# Patient Record
Sex: Male | Born: 1985 | Race: White | Hispanic: No | Marital: Married
Health system: Southern US, Community
[De-identification: ages and names within clinical notes are randomized; demographics above are authoritative.]

## PROBLEM LIST (undated history)

## (undated) DIAGNOSIS — R51 Headache: Secondary | ICD-10-CM

## (undated) DIAGNOSIS — F329 Major depressive disorder, single episode, unspecified: Secondary | ICD-10-CM

## (undated) DIAGNOSIS — IMO0001 Reserved for inherently not codable concepts without codable children: Secondary | ICD-10-CM

## (undated) DIAGNOSIS — G809 Cerebral palsy, unspecified: Secondary | ICD-10-CM

## (undated) DIAGNOSIS — F32A Depression, unspecified: Secondary | ICD-10-CM

## (undated) DIAGNOSIS — F419 Anxiety disorder, unspecified: Secondary | ICD-10-CM

---
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
## 2014-02-28 ENCOUNTER — Emergency Department (HOSPITAL_COMMUNITY): Payer: Self-pay | Admitting: Emergency Medicine

## 2014-08-04 ENCOUNTER — Inpatient Hospital Stay
Admission: AD | Admit: 2014-08-04 | Discharge: 2014-08-04 | DRG: 101 | Discharge status: Against Medical Advice | Payer: MEDICAID | Source: Other Acute Inpatient Hospital | Attending: Neurology | Admitting: Neurology

## 2014-08-04 ENCOUNTER — Encounter (HOSPITAL_COMMUNITY): Payer: Self-pay | Admitting: Neurology

## 2014-08-04 ENCOUNTER — Inpatient Hospital Stay (HOSPITAL_COMMUNITY): Payer: MEDICAID | Admitting: Internal Medicine

## 2014-08-04 DIAGNOSIS — R569 Unspecified convulsions: Secondary | ICD-10-CM | POA: Diagnosis present

## 2014-08-04 DIAGNOSIS — G809 Cerebral palsy, unspecified: Secondary | ICD-10-CM | POA: Diagnosis present

## 2014-08-04 DIAGNOSIS — F419 Anxiety disorder, unspecified: Secondary | ICD-10-CM | POA: Diagnosis present

## 2014-08-04 DIAGNOSIS — S0990XA Unspecified injury of head, initial encounter: Secondary | ICD-10-CM | POA: Diagnosis present

## 2014-08-04 DIAGNOSIS — R55 Syncope and collapse: Secondary | ICD-10-CM

## 2014-08-04 DIAGNOSIS — F319 Bipolar disorder, unspecified: Secondary | ICD-10-CM | POA: Diagnosis present

## 2014-08-04 DIAGNOSIS — W1830XA Fall on same level, unspecified, initial encounter: Secondary | ICD-10-CM | POA: Diagnosis present

## 2014-08-04 HISTORY — DX: Reserved for inherently not codable concepts without codable children: IMO0001

## 2014-08-04 HISTORY — DX: Depression, unspecified: F32.A

## 2014-08-04 HISTORY — DX: Cerebral palsy, unspecified (CMS HCC): G80.9

## 2014-08-04 HISTORY — DX: Anxiety disorder, unspecified: F41.9

## 2014-08-04 LAB — CBC/DIFF
BASOPHILS: 1 %
BASOS ABS: 0.1 THOU/uL (ref 0.000–0.200)
EOS ABS: 0.295 10*3/uL (ref 0.000–0.500)
EOSINOPHIL: 2 %
HCT: 42.7 % (ref 36.7–47.0)
HGB: 14.4 g/dL (ref 12.5–16.3)
LYMPHOCYTES: 25 %
LYMPHS ABS: 2.969 10*3/uL (ref 1.000–4.800)
MCH: 29 pg (ref 27.4–33.0)
MCHC: 33.7 g/dL (ref 32.5–35.8)
MCV: 86.1 fL (ref 78–100)
MONOCYTES: 8 %
MONOS ABS: 0.976 10*3/uL (ref 0.300–1.000)
MONOS ABS: 0.976 THOU/uL (ref 0.300–1.000)
MPV: 8 fL (ref 7.5–11.5)
PLATELET COUNT: 285 THOU/uL (ref 140–450)
PMN ABS: 7.771 10*3/uL — ABNORMAL HIGH (ref 1.500–7.700)
PMN'S: 64 %
RBC: 4.96 MIL/uL (ref 4.06–5.63)
RBC: 4.96 MIL/uL (ref 4.06–5.63)
RDW: 14.1 % (ref 12.0–15.0)
WBC: 12.1 THOU/uL — ABNORMAL HIGH (ref 3.5–11.0)

## 2014-08-04 LAB — MAGNESIUM: MAGNESIUM: 2.1 mg/dL (ref 1.6–2.5)

## 2014-08-04 LAB — BASIC METABOLIC PANEL
ANION GAP: 11 mmol/L (ref 4–13)
BUN/CREAT RATIO: 15 (ref 6–22)
BUN: 13 mg/dL (ref 8–25)
CALCIUM: 9.7 mg/dL (ref 8.5–10.4)
CARBON DIOXIDE: 25 mmol/L (ref 22–32)
CHLORIDE: 105 mmol/L (ref 96–111)
CHLORIDE: 105 mmol/L (ref 96–111)
CREATININE: 0.85 mg/dL (ref 0.62–1.27)
ESTIMATED GLOMERULAR FILTRATION RATE: >59 ml/min/1.73m2 (ref >59)
GLUCOSE,NONFAST: 99 mg/dL (ref 65–139)
POTASSIUM: 3.9 mmol/L (ref 3.5–5.1)
SODIUM: 141 mmol/L (ref 136–145)

## 2014-08-04 LAB — PHOSPHORUS: PHOSPHORUS: 3.3 mg/dL (ref 2.4–4.7)

## 2014-08-04 MED ORDER — ACETAMINOPHEN 325 MG TABLET
650.0000 mg | ORAL_TABLET | ORAL | Status: DC | PRN
Start: 2014-08-04 — End: 2014-08-04

## 2014-08-04 MED ORDER — BISACODYL 5 MG TABLET,DELAYED RELEASE
5.0000 mg | DELAYED_RELEASE_TABLET | Freq: Every evening | ORAL | Status: DC | PRN
Start: 2014-08-04 — End: 2014-08-04
  Filled 2014-08-04 (×2): qty 1

## 2014-08-04 MED ORDER — ALPRAZOLAM 0.5 MG TABLET
1.00 mg | ORAL_TABLET | Freq: Two times a day (BID) | ORAL | Status: DC | PRN
Start: 2014-08-04 — End: 2014-08-04

## 2014-08-04 MED ORDER — ENOXAPARIN 40 MG/0.4 ML SUBCUTANEOUS SYRINGE
40.0000 mg | INJECTION | SUBCUTANEOUS | Status: DC
Start: 2014-08-04 — End: 2014-08-04
  Filled 2014-08-04: qty 0.4

## 2014-08-04 MED ORDER — RISPERIDONE 1 MG TABLET
1.00 mg | ORAL_TABLET | Freq: Every evening | ORAL | Status: DC
Start: 2014-08-04 — End: 2014-08-04
  Filled 2014-08-04 (×2): qty 1

## 2014-08-04 MED ORDER — LURASIDONE 40 MG TABLET
60.0000 mg | ORAL_TABLET | Freq: Every day | ORAL | Status: DC
Start: 2014-08-04 — End: 2014-08-04
  Filled 2014-08-04 (×2): qty 1

## 2014-08-04 MED ORDER — SENNOSIDES 8.6 MG-DOCUSATE SODIUM 50 MG TABLET
1.0000 | ORAL_TABLET | Freq: Two times a day (BID) | ORAL | Status: DC | PRN
Start: 2014-08-04 — End: 2014-08-04
  Filled 2014-08-04 (×2): qty 1

## 2014-08-04 MED ORDER — MULTIVITAMIN TABLET
1.0000 | ORAL_TABLET | Freq: Every day | ORAL | Status: DC
Start: 2014-08-04 — End: 2014-08-04
  Filled 2014-08-04: qty 1

## 2014-08-04 MED ORDER — DIVALPROEX 500 MG TABLET,DELAYED RELEASE
500.00 mg | DELAYED_RELEASE_TABLET | Freq: Every evening | ORAL | Status: DC
Start: 2014-08-04 — End: 2014-08-04
  Filled 2014-08-04 (×2): qty 1

## 2014-08-04 MED ORDER — LEVETIRACETAM 500 MG TABLET
1500.00 mg | ORAL_TABLET | Freq: Every evening | ORAL | Status: DC
Start: 2014-08-04 — End: 2014-08-04
  Filled 2014-08-04 (×2): qty 3

## 2014-08-04 MED ORDER — ONDANSETRON HCL (PF) 4 MG/2 ML INJECTION SOLUTION
4.0000 mg | Freq: Four times a day (QID) | INTRAMUSCULAR | Status: DC | PRN
Start: 2014-08-04 — End: 2014-08-04

## 2014-08-04 MED ADMIN — sodium chloride 0.9 % intravenous solution: @ 04:00:00 | NDC 00338004904

## 2014-08-04 NOTE — Care Management Notes (Signed)
Patient left AMA this a.m. after refusing treatment. Unable to determine plan of care or any needs.

## 2014-08-04 NOTE — Progress Notes (Addendum)
Interval Progress Note    The patient refused to have an IV placed and became combative with the nursing staff. I discussed with the patient that he could not continue to be evaluated for seizures without IV placement in case he would actively start to seize and need an IV PRN medication. The patient asked if he could have something through his "butt" instead, and I feel that this is further evidence that he will continue to be inappropriate and potentially threatening towards staff members if he is to stay in hospital. Because the patient continues to refuse IV placement I will be discharging him AMA at this time since I will be unable to appropriately evaluate him for seizures. I discussed that per [facility] law the patient is unable to drive for at least 6 months after several episodes of unexplained loss of consciousness occurring earlier today. It was also explained that seizures can be potentially life threatening, and so he may be putting his life in danger by leaving the hospital with an incomplete medical evaluation. The patient voiced understanding but refused to sign [facility].     Hillery HunterMandy Hatfield, MD  08/04/2014, 03:14  Norva RiffleGauri Bradee Common, MD  08/04/2014, 12:26

## 2014-08-04 NOTE — Nurses Notes (Signed)
0200: Patient arrived to room 953 via EMS with reports of seizure like activity. Patient oriented to new environment. Assessment per flowsheet. Patient oriented to unit routines. I explained to patient he will need to wear a gown due to EEG leads and PIV that will be placed. Patient became anxious about having an IV and stated that he was told at previous facility that he would not need an IV. At that time, Dr Hillery HunterMandy Hatfield of Neurology arrived at bedside to assess patient and I informed her that patient is adamant that he does not want an IV. Dr Reymundo PollHatfield discussed with patient that he will need an IV to receive care due to risks associated with seizures. Patient then agreed to an IV.    0230: After Dr Reymundo PollHatfield completed assessment of patient, I entered room to place PIV. Patient became agitated and states he does not want "a metal IV". I attempted to explain that the IV has a metal needle to access vein and that only plastic catheter is left as part of saline lock. Patient told me that if I "try an IV and it hurts, there will be trouble". Patient then stated he was leaving and telephoned his brother, Franky MachoLuke. I told patient that I would not attempt an IV against his wishes. Patient became adamant that he wanted to leave hospital and had me speak to his brother who was apologetic and stated that patient "has done this before". Patient's brother [place]. Patient began dressing himself and became verbally aggressive and cursing towards his brother on the phone and at staff. Charge nurse, Melchor Amourarisa Tedrow, RN attempted to diffuse the situation to no avail and felt the patient was escalating towards physical aggression and thusly called security. At that time, I paged Dr Reymundo PollHatfield and notified her that patient did not want me to place an IV and was behaving aggressively towards staff. Security arrived at bedside. Patient continued to yell and curse at staff and his brother on the phone stating  that he did not want to be in the hospital. Dr Reymundo PollHatfield paged and arrived at bedside to discuss benefits of staying in the hospital and the risks of leaving. She stated that he would not be able to receive proper care without an IV and would have to discharge him AMA. Patient stated he wanted to leave but would not sign AMA form. Patient was escorted off unit by security at 0305.

## 2014-08-04 NOTE — H&P (Addendum)
[facility]   Neurology H&P      [name],[name], 29 y.o. male  Date of Admission:  08/04/2014  Date of Birth:  [birth year]    PCP: No primary care provider on file.    Information obtained from: patient  Chief Complaint: Seizures    ZOX:[id number] [name] is a 29 y.o., White male with past medical history of cerebral palsy, bipolar depression, and anxiety who presents as a transfer from an outside facility for the evaluation of spells. The patient reports that he has had over 100 seizures today during which he becomes zoned out and confused. He is unable to report how long the episodes last, but he states he has had seizures since the age of 29 years old. The patient retains consciousness at times, and at other times he has complete loss of consciousness. He reports that earlier today he lost consciousness with one of the episodes and he hit his face on the floor when he fell. He states that he has been frequently wetting the bed but denies biting his tongue or losing bowel continence. He gets headaches in association with his spells as well. Currently the patient is taking Keppra 2000 mg qhs and Depakote 500 mg qhs. He also takes Xanax 05. Mg BID PRN anxiety. He denies missing any doses of these medications. Additionally the patient denies any recent illness including UTI, URI or GI illness. The outside facility staff noted facial twitching without loss of consciousness or fall while he was being evaluated in the ED.     Admission Source:  Transfer from another hospital -  [place]  Advance Directives:  None-Discussed  Hospice involvement prior to admission?  Not applicable    Location (of pain): Quality (character of pain) Severity (minimal, mild, severe, scale or 1-10) Duration (how long has pain/sx present) Timing (when does pain/sx occur)  Context (activity at/before onset) Modifying Factors (what makes pain/sx  Better/worse) Associate Sign/Sx (what accompanies main pain/sx)    Past Medical History      Diagnosis Date    Spells     Anxiety     Depression     Cerebral palsy      Surgical History: None  No past surgical history on file.    Medications Prior to Admission     Prescriptions    ALPRAZolam (XANAX) 1 mg Oral Tablet    Take 1 mg by mouth Twice per day as needed for Anxiety    divalproex (DEPAKOTE) 500 mg Oral Tablet, Delayed Release (E.C.)    Take 500 mg by mouth Every evening    LevETIRAcetam (KEPPRA) 750 mg Oral Tablet    Take 1,500 mg by mouth Every evening    multivitamin Oral Tablet    Take 1 Tab by mouth Once a day    risperiDONE (RISPERDAL) 1 mg Oral Tablet    Take 1 mg by mouth Every evening        Allergies not on file  History   Substance Use Topics    Smoking status: Not on file    Smokeless tobacco: Not on file    Alcohol Use: Not on file     Family History: No family history of seizures.     Review of Systems:  Constitutional: Negative for fever, chills, and weakness.   Head/Eyes: Negative for vision changes, headaches or recent head trauma.   ENT: Negative for neck pain or difficulty swallowing.   Cardiovascular: Negative for chest pain, palpitations and leg swelling.  Respiratory: Negative for experiencing shortness of breath or respiratory distress.   Gastrointestinal: Negative for nausea, vomiting, abdominal pain, diarrhea and constipation.   Renal/Urinary: Negative for dysuria or changes in urgency and frequency.  Skin: Negative for rashes and pruritis.   Musculoskeletal: Negative for joint or muscle pain.   Neurological: Positive for spells of loss of consciousness  Endocrine/Metabolic: Negative for polydipsia or heat/cold intolerance.   Hematological: Negative easy bleeding or clotting history   Psychiatric: Positive for bipolar disorder and anxiety    Exam:  Temperature: 36.7 C (98.1 F)  Heart Rate: 90  BP (Non-Invasive): 104/72 mmHg  Respiratory Rate: 18  General: no distress  HENT:Periorbital ecchymosis noted above the right eye  Neck: No JVD or thyromegaly or  lymphadenopathy  Carotids:Carotids normal without bruit  Lungs: Clear to auscultation bilaterally.   Cardiovascular: regular rate and rhythm  Abdomen: Soft, non-tender, Bowel sounds normal  Extremities: No cyanosis or edema  Ophthalomscopic: normal w/o hemorrhages, exudates, or papilledema  Mental status:  Level of Consciousness: alert  Orientations: Alert and oriented x 3  MemoryRegistration, Recall, and Following of commands is normal  AttentionsAttention and Concentration are normal  Knowledge: Good  Language: Normal  Speech: Normal  Cranial nerves:   CN2: Visual acuity and fields intact  CN 3,4,6: PERRLA. Patient is noted to have dysconjugate gaze with right eye resting in the down and out position. Patient states this has been present since birth.   CN 5Facial sensation intact  CN 7Face symmetrical  CN 8: Hearing grossly intact  CN 9,10: Palate symmetric and gag normal  CN 11: Sternocleidomastoid and Trapezius have normal strength.  CN 12: Tongue normal with no fasiculations or deviation  Gait, Coordination, and Reflexes:   Gait: Normal  Coordination: Coordination is normal without tremor    Muscle tone: WNL  Muscle exam  Arm Right Left Leg Right Left   Deltoid 5/5 5/5 Iliopsoas 5/5 5/5   Biceps 5/5 5/5 Quads 5/5 5/5   Triceps 5/5 5/5 Hamstrings 5/5 5/5   Wrist Extension 5/5 5/5 Ankle Dorsi Flexion 5/5 5/5   Wrist Flexion 5/5 5/5 Ankle Plantar Flexion 5/5 5/5   Interossei 5/5 5/5 Ankle Eversion 5/5 5/5   APB 5/5 5/5 Ankle Inversion 5/5 5/5       Reflexes   RJ BJ TJ KJ AJ Plantars Hoffman's   Right 2+ 2+ 2+ 2+ 2+ Downgoing Not present   Left 2+ 2+ 2+ 2+ 2+ Downgoing Not present     Sensory: Sensory exam in the upper and lower extremities is normal  Normal color, texture and turgor without significant lesions or rashes  Diabetes Monitors:   FOOTEXAM: Both feet without edema or ulcerations. Pulses normal bilaterally. Sensation normal bilaterally      Labs:    Lab Results for Last 24 Hours:    Results for orders  placed or performed during the hospital encounter of 08/04/14 (from the past 24 hour(s))   CBC/DIFF   Result Value Ref Range    WBC 12.1 (H) 3.5 - 11.0 THOU/uL    RBC 4.96 4.06 - 5.63 MIL/uL    HGB 14.4 12.5 - 16.3 g/dL    HCT 16.1 09.6 - 04.5 %    MCV 86.1 78 - 100 fL    MCH 29.0 27.4 - 33.0 pg    MCHC 33.7 32.5 - 35.8 g/dL    RDW 40.9 81.1 - 91.4 %    PLATELET COUNT 285 140 - 450 THOU/uL  MPV 8.0 7.5 - 11.5 fL    PMN'S 64 %    PMN ABS 7.771 (H) 1.500 - 7.700 THOU/uL    LYMPHOCYTES 25 %    LYMPHS ABS 2.969 1.000 - 4.800 THOU/uL    MONOCYTES 8 %    MONOS ABS 0.976 0.300 - 1.000 THOU/uL    EOSINOPHIL 2 %    EOS ABS 0.295 0.000 - 0.500 THOU/uL    BASOPHILS 1 %    BASOS ABS 0.100 0.000 - 0.200 THOU/uL   BASIC METABOLIC PANEL, NON-FASTING   Result Value Ref Range    SODIUM 141 136 - 145 mmol/L    POTASSIUM 3.9 3.5 - 5.1 mmol/L    CHLORIDE 105 96 - 111 mmol/L    CARBON DIOXIDE 25 22 - 32 mmol/L    ANION GAP 11 4 - 13 mmol/L    CREATININE 0.85 0.62 - 1.27 mg/dL    ESTIMATED GLOMERULAR FILTRATION RATE >59 >59 ml/min/1.6473m2    GLUCOSE,NONFAST 99 65 - 139 mg/dL    BUN 13 8 - 25 mg/dL    BUN/CREAT RATIO 15 6 - 22    CALCIUM 9.7 8.5 - 10.4 mg/dL   MAGNESIUM   Result Value Ref Range    MAGNESIUM 2.1 1.6 - 2.5 mg/dL   PHOSPHORUS   Result Value Ref Range    PHOSPHORUS 3.3 2.4 - 4.7 mg/dL   THYROID STIMULATING HORMONE WITH FREE T4 REFLEX   Result Value Ref Range    THYROID STIMULATING HORMONE WITH FREE T4 REFLEX 1.152 0.350 - 5.000 uIU/mL       Review of reports and notes reveal:        Independent Interpretation of images or specimens:  None to review.     Assessment/Plan: The patient is a 29 year old male with past medical history of cerebral palsy, bipolar depression, and anxiety who presents as a transfer from an outside facility for the evaluation of spells.    Spells of Altered Consciousness  - Will place on vEEG monitoring with the goal of spell capture  - Continue home AEDs including Keppra 2000 mg qhs and Depakote 500  mg qhs  - Basic labs pending including urine drug screen  - Seizure precautions, neuro checks, vitals    Head Injury  - Patient reports fall from standing during one of his spells  - CT Brain wo contrast ordered to r/o head injury    Bipolar Disorder, Anxiety  - Continue home Latuda 60 mg daily and Risperdal 1 mg qhs  - Continue home Xanax 0.5 mg BID PRN anxiety    DNR Status this admission:  Full Code  Palliative/Supportive Care consulted?  no  Hospice Consulted?  Not applicable    Current Comorbid Conditions:  Brain Compression:  no  Obstructive Hydocephalus:  no  Coma -Not applicable  Eye opening: 4 spontaneous, Verbal resonse:  5 oriented, Best motor response:  6 obeys commands  TIA not applicable  Cerebral Edema:  no  Encephalopathy:  no  Encephalitis-Not applicable  Seizure-Unspecified   Respiratory Failure/Other-Not applicable  Coagulopathy Not applicable      DVT/PE Prophylaxis: Lovenox    Hillery HunterMandy Hatfield, MD  08/04/2014, 04:49        Did not see the patient.  Norva RiffleGauri Jese Comella, MD  08/04/2014, 12:26

## 2014-08-06 DIAGNOSIS — Z8659 Personal history of other mental and behavioral disorders: Secondary | ICD-10-CM

## 2014-08-06 DIAGNOSIS — R29818 Other symptoms and signs involving the nervous system: Secondary | ICD-10-CM

## 2014-08-06 DIAGNOSIS — F4310 Post-traumatic stress disorder, unspecified: Secondary | ICD-10-CM

## 2014-08-07 ENCOUNTER — Encounter (HOSPITAL_COMMUNITY): Payer: MEDICAID

## 2014-08-07 ENCOUNTER — Inpatient Hospital Stay
Admission: AD | Admit: 2014-08-07 | Discharge: 2014-08-12 | DRG: 101 | Disposition: A | Discharge status: Home / Self Care | Payer: MEDICAID | Source: Other Acute Inpatient Hospital | Attending: Internal Medicine | Admitting: Internal Medicine

## 2014-08-07 ENCOUNTER — Encounter (HOSPITAL_COMMUNITY): Payer: Self-pay

## 2014-08-07 DIAGNOSIS — F411 Generalized anxiety disorder: Secondary | ICD-10-CM | POA: Diagnosis present

## 2014-08-07 DIAGNOSIS — Z720 Tobacco use: Secondary | ICD-10-CM

## 2014-08-07 DIAGNOSIS — G40919 Epilepsy, unspecified, intractable, without status epilepticus: Principal | ICD-10-CM | POA: Diagnosis present

## 2014-08-07 DIAGNOSIS — F1290 Cannabis use, unspecified, uncomplicated: Secondary | ICD-10-CM | POA: Diagnosis present

## 2014-08-07 DIAGNOSIS — F410 Panic disorder [episodic paroxysmal anxiety] without agoraphobia: Secondary | ICD-10-CM | POA: Diagnosis present

## 2014-08-07 DIAGNOSIS — F319 Bipolar disorder, unspecified: Secondary | ICD-10-CM | POA: Diagnosis present

## 2014-08-07 DIAGNOSIS — R569 Unspecified convulsions: Secondary | ICD-10-CM

## 2014-08-07 DIAGNOSIS — S40811A Abrasion of right upper arm, initial encounter: Secondary | ICD-10-CM | POA: Diagnosis present

## 2014-08-07 DIAGNOSIS — S0081XA Abrasion of other part of head, initial encounter: Secondary | ICD-10-CM | POA: Diagnosis present

## 2014-08-07 DIAGNOSIS — F419 Anxiety disorder, unspecified: Secondary | ICD-10-CM

## 2014-08-07 DIAGNOSIS — R6889 Other general symptoms and signs: Secondary | ICD-10-CM

## 2014-08-07 DIAGNOSIS — G809 Cerebral palsy, unspecified: Secondary | ICD-10-CM | POA: Diagnosis present

## 2014-08-07 DIAGNOSIS — IMO0001 Reserved for inherently not codable concepts without codable children: Secondary | ICD-10-CM

## 2014-08-07 HISTORY — DX: Headache: R51

## 2014-08-07 LAB — HEPATIC FUNCTION PANEL
ALBUMIN: 3.6 g/dL (ref 3.5–5.0)
ALKALINE PHOSPHATASE: 56 U/L (ref <150)
ALT (SGPT): 25 U/L (ref <55)
AST (SGOT): 20 U/L (ref 8–48)
BILIRUBIN, TOTAL: 0.4 mg/dL (ref 0.3–1.3)
BILIRUBIN,CONJUGATED: <0.2 mg/dL (ref <0.3)
TOTAL PROTEIN: 6.7 g/dL (ref 6.4–8.3)

## 2014-08-07 LAB — DRUG SCREEN, HIGH OPIATE CUTOFF, NO CONFIRMATION, URINE
AMPHETAMINE, URINE: NEGATIVE
BUPRENORPHINE, URINE QL: NEGATIVE
ECSTASY/MDMA QUAL, URINE: NEGATIVE
METHADONE, URINE: NEGATIVE
OPIATE, URINE: NEGATIVE
RANDOM URINE CREATININE: 292 mg/dL — ABNORMAL HIGH (ref 20–200)

## 2014-08-07 LAB — CBC/DIFF
BASOPHILS: 1 %
BASOS ABS: 0.071 THOU/uL (ref 0.000–0.200)
EOS ABS: 0.22 10*3/uL (ref 0.000–0.500)
EOSINOPHIL: 2 %
HCT: 43 % (ref 36.7–47.0)
HGB: 14.1 g/dL (ref 12.5–16.3)
LYMPHOCYTES: 26 %
LYMPHS ABS: 2.74 THOU/uL (ref 1.000–4.800)
MCH: 28.7 pg (ref 27.4–33.0)
MCHC: 32.9 g/dL (ref 32.5–35.8)
MCV: 87.3 fL (ref 78–100)
MONOCYTES: 10 %
MONOS ABS: 0.997 THOU/uL (ref 0.300–1.000)
MPV: 8.4 fL (ref 7.5–11.5)
PLATELET COUNT: 290 10*3/uL (ref 140–450)
PMN ABS: 6.501 THOU/uL (ref 1.500–7.700)
PMN'S: 61 %
RBC: 4.93 MIL/uL (ref 4.06–5.63)
RDW: 13.9 % (ref 12.0–15.0)
WBC: 10.5 10*3/uL (ref 3.5–11.0)

## 2014-08-07 LAB — BASIC METABOLIC PANEL
ANION GAP: 10 mmol/L (ref 4–13)
BUN/CREAT RATIO: 24 — ABNORMAL HIGH (ref 6–22)
BUN: 22 mg/dL (ref 8–25)
CALCIUM: 9.5 mg/dL (ref 8.5–10.4)
CARBON DIOXIDE: 26 mmol/L (ref 22–32)
CHLORIDE: 103 mmol/L (ref 96–111)
CREATININE: 0.9 mg/dL (ref 0.62–1.27)
ESTIMATED GLOMERULAR FILTRATION RATE: >59 ml/min/1.73m2 (ref >59)
GLUCOSE,NONFAST: 93 mg/dL (ref 65–139)
POTASSIUM: 3.8 mmol/L (ref 3.5–5.1)
SODIUM: 139 mmol/L (ref 136–145)

## 2014-08-07 LAB — VALPROIC ACID LEVEL
TIME OF LAST DOSE: 1814 h
VALPROIC ACID: 34 ug/mL — ABNORMAL LOW (ref 50–100)

## 2014-08-07 MED ORDER — MULTIVITAMIN TABLET
1.0000 | ORAL_TABLET | Freq: Every day | ORAL | Status: DC
Start: 2014-08-07 — End: 2014-08-12
  Administered 2014-08-07 – 2014-08-12 (×6): 1 via ORAL
  Filled 2014-08-07 (×6): qty 1

## 2014-08-07 MED ORDER — RISPERIDONE 1 MG TABLET
1.00 mg | ORAL_TABLET | Freq: Every evening | ORAL | Status: DC
Start: 2014-08-07 — End: 2014-08-07
  Filled 2014-08-07: qty 1

## 2014-08-07 MED ORDER — DIVALPROEX 500 MG TABLET,DELAYED RELEASE
500.00 mg | DELAYED_RELEASE_TABLET | Freq: Every evening | ORAL | Status: DC
Start: 2014-08-07 — End: 2014-08-08
  Administered 2014-08-07: 500 mg via ORAL
  Filled 2014-08-07 (×2): qty 1

## 2014-08-07 MED ORDER — NICOTINE 21 MG/24 HR DAILY TRANSDERMAL PATCH
21.0000 mg | MEDICATED_PATCH | Freq: Every day | TRANSDERMAL | Status: DC
Start: 2014-08-08 — End: 2014-08-12
  Administered 2014-08-08: 21 mg via TRANSDERMAL
  Administered 2014-08-09: 0 mg via TRANSDERMAL
  Administered 2014-08-09: 21 mg via TRANSDERMAL
  Administered 2014-08-10: 0 mg via TRANSDERMAL
  Administered 2014-08-11 – 2014-08-12 (×3): 21 mg via TRANSDERMAL
  Filled 2014-08-07 (×5): qty 1

## 2014-08-07 MED ORDER — ENOXAPARIN 40 MG/0.4 ML SUBCUTANEOUS SYRINGE
40.0000 mg | INJECTION | SUBCUTANEOUS | Status: DC
Start: 2014-08-07 — End: 2014-08-12
  Administered 2014-08-07 – 2014-08-11 (×5): 40 mg via SUBCUTANEOUS
  Filled 2014-08-07: qty 0.4
  Filled 2014-08-07: qty 0
  Filled 2014-08-07 (×3): qty 0.4
  Filled 2014-08-07: qty 0

## 2014-08-07 MED ORDER — ALPRAZOLAM 0.5 MG TABLET
1.00 mg | ORAL_TABLET | Freq: Two times a day (BID) | ORAL | Status: DC | PRN
Start: 2014-08-07 — End: 2014-08-12
  Administered 2014-08-08 – 2014-08-11 (×3): 1 mg via ORAL
  Filled 2014-08-07 (×4): qty 2

## 2014-08-07 MED ORDER — ACETAMINOPHEN 325 MG TABLET
650.0000 mg | ORAL_TABLET | ORAL | Status: DC | PRN
Start: 2014-08-07 — End: 2014-08-12
  Administered 2014-08-08 – 2014-08-11 (×6): 650 mg via ORAL
  Filled 2014-08-07 (×7): qty 2

## 2014-08-07 MED ORDER — LEVETIRACETAM 500 MG TABLET
1000.00 mg | ORAL_TABLET | Freq: Two times a day (BID) | ORAL | Status: DC
Start: 2014-08-07 — End: 2014-08-08
  Administered 2014-08-07 – 2014-08-08 (×2): 1000 mg via ORAL
  Filled 2014-08-07 (×3): qty 2

## 2014-08-07 MED ORDER — LEVETIRACETAM 500 MG TABLET
2000.00 mg | ORAL_TABLET | Freq: Every evening | ORAL | Status: DC
Start: 2014-08-07 — End: 2014-08-07
  Filled 2014-08-07: qty 4

## 2014-08-07 NOTE — Care Management Notes (Signed)
M[facility] INTAKE      Referring Provider and Contact Number:  Dr. Milinda Pointer   Transfer Source and Contact Number:   [facility] [phone]  Transfer Emergent: No     Date Admitted:  07/28/14  Diagnosis:  Partial Seizures   PMH:      Past Medical History  Current Outpatient Prescriptions   Medication Sig    ALPRAZolam (XANAX) 1 mg Oral Tablet Take 1 mg by mouth Twice per day as needed for Anxiety    divalproex (DEPAKOTE) 500 mg Oral Tablet, Delayed Release (E.C.) Take 500 mg by mouth Every evening    LevETIRAcetam (KEPPRA) 750 mg Oral Tablet Take 2,000 mg by mouth Every evening     multivitamin Oral Tablet Take 1 Tab by mouth Once a day    risperiDONE (RISPERDAL) 1 mg Oral Tablet Take 1 mg by mouth Every evening       Past Medical History   Diagnosis Date    Spells     Anxiety     Depression     Cerebral palsy        Dialysis:   No     Cancer:  No     Isolation:  No     Is patient established with family practice/attending?    Reason for transfer:  Pt needs continuous EEG monitoring     Patient story/clinical presentation:  Patient presented with symptoms of partial seizures starting last week.  During event patient's HR increases to 140, the event only lasts briefly and his neck turns to the left and he clinches up and is briefly confused after event.  [facility] physician increased home dose of Keppra and Depakote but the patient continuous to have several of these partial seizures throughout the day.  MRI head showed nonspecific focus and regular EEG showed generalized slowing.  Neurologist at [facility] recommends patient needs 24 hr EEG monitoring which is reason for transfer.  IV ativan given for partial seizures but it makes the patient sleep for several hours after and family does not like this side effect, so they have backed off giving ativan as frequently per request of family.      Current vital signs:    HR:     80   BP:   118/52   Resp:     16   Temp:     37.3   Sats:     95%   O2:     RA        Per MD labs WNL unless noted below.    Labs:  WBC (3.6-11)    HGB (13.1-17.3)    Hct (39.8-50.2)    PLT (140-400)    Na (135-145)    K+ (3.5-5.1)    CL (96-111)    CO2 (23-35)    BUN (8-26)    Cr (0.62-1.27)    Glucose (60-105)    Ca (8.5-10.4)    Mag (1.6-2.5)    Phos (2.4-4.7)    BNP (<100)    D-Dimer (<233)    AST (8-41)    ALT (<55)    Alk Phos (<150)    Amylase (25-125)    Lipase (10-80)    T. Bili (0.3-1.3)    PT (8.7-13.2)    PTT (25.1-36.5)    INR (0.8-1.2)    Trip-1 (<0.03)    CK    CPK    CK-MB    ABG ph    ABG PCO2    ABG PO2  ABG HCO3    Base def/exc    LP Glucose    LP Neutrophil    LP Protein    LP WBC    LP Other    Ammonia Level    Prolactin Level  Admit 53  Now 36  65.4       Radiology (please place images on image grid): MRI head and regular EEG uploaded to image grid if they have the capability.  EKG:  IV Access:  Medications, IVF, Drips: IV ativan PRN     Oxygen/Bipap/Vent settings:    TV:        Peep:        FiO2:        Rate:            Pt class and accommodation:  Inpatient/Semi-private tele EMU bed   Service and accepting MD:  Neurology 1/Dr. Joseph Pierini     *Send Text Page to accepting service MD. *

## 2014-08-07 NOTE — Nurses Notes (Signed)
Patient staring and turned head to side . Grabbing at siderails but answering brother's questions.Side rails padded.

## 2014-08-07 NOTE — Nurses Notes (Signed)
Patient staring with head turned to right. Grabbing at siderails and talking lasting 15 seconds.

## 2014-08-07 NOTE — H&P (Addendum)
[facility]   Neurology H&P      [name],[name], [age] y.o. male  Date of Admission:  08/07/2014  Date of Birth:  05/06/1985    PCP: No Established Pcp    Information obtained from: patient and brother  Chief Complaint:  Seplls    GNF:[id number]:[name] Rubie MaidMikita is a 29 y.o., White male  with past medical history of cerebral palsy, bipolar depression, and anxiety who [facility] of spells. Patient has been having multiple spells per day that last only a few seconds. He is not postical afterwards. No incontinence. No LOC. He is able to commnicate during spell. He has been evaluated in 8 hospitals recently. His Neurologist Dr. Marney Doctorana told him it was psychiatric. Patient was recently admitted to the service on 4/13 but left AMA shortly after admission. Per Dr. Willette ClusterHatfield's note "The patient reports that he has had over 100 seizures today during which he becomes zoned out and confused. He is unable to report how long the episodes last, but he states he has had seizures since the age of [age] years old. The patient retains consciousness at times, and at other times he has complete loss of consciousness. He reports that earlier today he lost consciousness with one of the episodes and he hit his face on the floor when he fell. He states that he has been frequently wetting the bed but denies biting his tongue or losing bowel continence. He gets headaches in association with his spells as well. Currently the patient is taking Keppra 2000 mg qhs and Depakote 500 mg qhs. He also takes Xanax 05. Mg BID PRN anxiety. He denies missing any doses of these medications. Additionally the patient denies any recent illness including UTI, URI or GI illness. The outside facility staff noted facial twitching without loss of consciousness or fall while he was being evaluated in the ED."    Admission Source:  [facility]  Advance Directives:  None-Discussed  Hospice involvement prior to  admission?  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    Medications Prior to Admission     Prescriptions    ALPRAZolam (XANAX) 1 mg Oral Tablet    Take 1 mg by mouth Twice per day as needed for Anxiety    divalproex (DEPAKOTE) 500 mg Oral Tablet, Delayed Release (E.C.)    Take 500 mg by mouth Every evening    LevETIRAcetam (KEPPRA) 750 mg Oral Tablet    Take 2,000 mg by mouth Every evening     multivitamin Oral Tablet    Take 1 Tab by mouth Once a day    risperiDONE (RISPERDAL) 1 mg Oral Tablet    Take 1 mg by mouth Every evening        Allergies no known allergies     History   Substance Use Topics    Smoking status: yes    Smokeless tobacco: Not on file    Alcohol Use: No     Family History:     -- No family history of seizures.   -- Hx of DM and Heart Disease    ROS: Other than ROS  in the HPI, all other systems were negative.    Exam:       General: appears chronically ill, unkempt male, abrasions on face and echimosis on right arm  HENT:Head atraumatic and normocephalic  Neck: No JVD or thyromegaly  Carotids:Carotids normal without bruit  Lungs: Clear to auscultation bilaterally.   Cardiovascular: regular rate and rhythm  Abdomen: Soft, non-tender  Extremities: No cyanosis or edema  Ophthalomscopic: normal w/o hemorrhages, exudates, or papilledema  Mental status:  Level of Consciousness: alert  Orientations: Alert and oriented x 3  MemoryRegistration, Recall, and Following of commands is normal  AttentionsAttention and Concentration are normal  Knowledge: Good  Language: Normal  Speech: Normal  Cranial nerves:   CN2: Visual acuity and fields intact  CN 3,4,6: EOMI, PERRLA  CN 5Facial sensation intact  CN 7Face symmetrical  CN 8: Hearing grossly intact  CN 9,10: Palate symmetric  CN 11: Sternocleidomastoid and Trapezius have  normal strength.  CN 12: Tongue normal with no fasiculations or deviation  Gait, Coordination, and Reflexes:   Gait: did not evaluate due to frequent spells  Coordination: Coordination is normal without tremor    Muscle tone: WNL  Muscle exam  Arm Right Left Leg Right Left   Deltoid 5/5 5/5 Iliopsoas 5/5 5/5   Biceps 5/5 5/5 Quads 5/5 5/5   Triceps 5/5 5/5 Hamstrings 5/5 5/5   Wrist Extension 5/5 5/5 Ankle Dorsi Flexion 5/5 5/5   Wrist Flexion 5/5 5/5 Ankle Plantar Flexion 5/5 5/5   Interossei 5/5 5/5 Ankle Eversion 5/5 5/5   APB 5/5 5/5 Ankle Inversion 5/5 5/5       Reflexes   RJ BJ TJ KJ AJ Plantars Hoffman's   Right 2+ 2+ 2+ 2+ 2+ Downgoing Not present   Left 2+ 2+ 2+ 2+ 2+ Downgoing Not present     Sensory: Sensory exam in the upper and lower extremities is normal  Normal color, texture and turgor without significant lesions or rashes  Diabetes Monitors:  Patient not a diabetic.      Labs:    I have reviewed all lab results.      Independent Interpretation of images or specimens:  None to review    Assessment/Plan:  Active Hospital Problems    Diagnosis    Spells    Cerebral palsy    Bipolar depression    Anxiety    Seizure       Patient is a 29 year old male with past medical history of cerebral palsy, bipolar depression, and anxiety who presents as a transfer from an outside facility for the evaluation of spells.    Spells    - Witnessed numerous spells at bedside. Appear non epileptic.  - Will place on vEEG monitoring with the goal of spell capture  - Continue home AEDs including Keppra 1000 mg BID and Depakote 500 mg qhs. Will check Depakote level.  - Basic labs pending including urine drug screen  - Seizure precautions, neuro checks, vitals      Bipolar Disorder, Anxiety    - Continue home Xanax 0.5 mg BID PRN anxiety    DNR Status this admission:  Full Code  Palliative/Supportive Care consulted?  no  Hospice Consulted?  Not applicable      Risk Factors:  Brain Compression:  no  Obstructive  Hydocephalus:  no  Coma -Not applicable  TIA not applicable  Cerebral Edema:  no  Encephalopathy:  no  Encephalitis-Not applicable  Seizure-Not applicable   Respiratory  Failure/Other-Not applicable  Coagulopathy Not applicable          DVT/PE Prophylaxis: Lovenox    [name] Curls, DO 08/07/2014, 18:15       Late entry for 08/07/14. I saw and examined the patient.  I reviewed the resident's note.  I agree with the findings and plan of care as documented in the resident's note.  Any exceptions/additions are edited/noted.    Georges Mouse, MD 08/08/2014, 08:65

## 2014-08-07 NOTE — Progress Notes (Addendum)
[facility]  Neurology Progress Note      [name],[name], 29 y.o. male  Date of Admission:  08/07/2014  Date of service: 08/08/2014  Date of Birth:  August 07, 1985      Chief Complaint: Spells  Pt's condition today: stable      Subjective: Patient was seen and examined. Hooked up to VEEG overnight. Had spells overnight. Brother at bedside pushed button for typical spells. Patient stares and turns to the right with his right arm. No postical period.     Vital Signs:  Temp (24hrs) Max:37 C (98.6 F)      Systolic (24hrs), Avg:110 mmHg, Min:107 mmHg, Max:116 mmHg    Diastolic (24hrs), Avg:66 mmHg, Min:59 mmHg, Max:72 mmHg    Temp  Avg: 36.7 C (98 F)  Min: 36.3 C (97.3 F)  Max: 37 C (98.6 F)  Pulse  Avg: 91.5  Min: 76  Max: 107  Resp  Avg: 17.5  Min: 16  Max: 18  SpO2  Avg: 95 %  Min: 94 %  Max: 96 %  Pain Score (Numeric, Faces): 4    Today's Physical Exam:    General:alert  Mental status:Alert and oriented x 3  Memory: Registration, Recall, and Following of commands is normal  Attention: Attention and Concentration are normal  Knowledge: Good  Language and Speech: Normal and Normal  Cranial nerves: Cranial nerves 2-12 are normal  Muscle tone: WNL  Motor strength:  Motor strength is normal throughout.  Sensory: Sensory exam in the upper and lower extremities is normal  Gait: Normal  Coordination: Coordination is normal without tremor  Reflexes: Reflexes are 2/2 throughout      Current Medications:    Current Facility-Administered Medications:  acetaminophen (TYLENOL) tablet 650 mg Oral Q4H PRN   ALPRAZolam (XANAX) tablet 1 mg Oral 2x/day PRN   divalproex (DEPAKOTE) 12 hr delayed release tablet 500 mg Oral QPM   enoxaparin PF (LOVENOX) 40 mg/0.4 mL SubQ injection 40 mg Subcutaneous Q24H   levETIRAcetam (KEPPRA) tablet 1,000 mg Oral 2x/day   multivitamin tablet 1 Tab Oral Daily   nicotine (NICODERM CQ) transdermal patch (mg/24 hr) 21 mg Transdermal Daily       I/O:  I/O last 24 hours:      Intake/Output  Summary (Last 24 hours) at 08/08/14 1610  Last data filed at 08/08/14 0400   Gross per 24 hour   Intake   1030 ml   Output    400 ml   Net    630 ml     I/O current shift:  04/17 0000 - 04/17 0759  In: 420 [P.O.:420]  Out: -     Labs  Please indicate ordered or reviewed)  Reviewed: I have reviewed all lab results.    Review of reports and notes reveal:     Independent Interpretation of images or specimens:  1. MRI of the Brain reviewed on Image grid and shows right frontal hyperintensity.      Assessment/Plan:  Active Hospital Problems    Diagnosis    Spells    Cerebral palsy    Bipolar depression    Anxiety    Seizure         Patient is a 30 year old male with PMH of epilepsy on Keppra and Depakote, cerebral palsy, bipolar depression, and anxiety who presents as a transfer from an outside facility for the evaluation of spells.    Seizures     - VEEG: Multiple spells captured overnight. Patient turns  to the right and speech arrest. Quickly returns to baseline.   -- Valproic Acid level is low; 34.   -- Ativan 2 mg IV now.   -- Will load with Depakote 20 mg/kg IV now. Increase Depakote to 750 mg BID. AST and ALT WNL.   - Continue  Keppra 1000 mg BID    -- Continue VEEG.   - Urine drug screen positive for THC  -- Neuro checks, Seizure precautions    DVT/PE Prophylaxis: Lovenox    Romona CurlsBenjamin Bradley Pollock, DO 08/08/2014, 06:38       Interval Update:        Patient continues to have right sided seizures. Will give Ativan 2 mg IV now and reload with Keppra 1000 mg IV . Increase Keppra maintainance to 1500 mg BID.     AED Regiment:     1. Keppra 1500 mg BID   2. Depakote 750 mg BID      Will check Depakote trough level tonight.   Continue VEEG. Will need repeat MRI once off VEEG.       Romona CurlsBenjamin Bradley Pollock, DO  08/08/2014, 14:37        I saw and examined the patient.  I reviewed the resident's note.  I agree with the findings and plan of care as documented in the resident's note.  Any exceptions/additions are  edited/noted.    Georges MouseShumaila Najee Cowens, MD 08/08/2014, 21:15

## 2014-08-07 NOTE — Nurses Notes (Signed)
1745[facility]. Alert oriented. Slow to respond to some questions. Brother at bed side and states this is his normal and he is easily distracted.Placed on sitter select and video monitor. Oriented to call system and not to get out of bed without assistance.

## 2014-08-07 NOTE — Nurses Notes (Signed)
Patient arrived to 9E via bed by two nurse escorts. Patient in stable condition and alert and oriented x4 upon arrival. Full assessment per flow sheet. Patient on sitter select as a safety precaution. Patient oriented to [employer] policies, procedures, and call bell system. Patient has no complaints of pain at this time. Patient awaiting VEEG placement. Will continue to assess.

## 2014-08-08 DIAGNOSIS — R569 Unspecified convulsions: Secondary | ICD-10-CM

## 2014-08-08 LAB — VALPROIC ACID LEVEL
TIME OF LAST DOSE: 1130 hr
VALPROIC ACID: 52 ug/mL (ref 50–100)

## 2014-08-08 MED ORDER — SODIUM CHLORIDE 0.9 % INTRAVENOUS SOLUTION
1000.00 mg | INTRAVENOUS | Status: AC
Start: 2014-08-08 — End: 2014-08-08
  Administered 2014-08-08: 1000 mg via INTRAVENOUS
  Filled 2014-08-08: qty 10

## 2014-08-08 MED ORDER — DIVALPROEX 250 MG TABLET,DELAYED RELEASE
750.00 mg | DELAYED_RELEASE_TABLET | Freq: Two times a day (BID) | ORAL | Status: DC
Start: 2014-08-08 — End: 2014-08-09
  Administered 2014-08-08 – 2014-08-09 (×2): 750 mg via ORAL
  Filled 2014-08-08 (×3): qty 1

## 2014-08-08 MED ORDER — DEXTROSE 5 % IN WATER (D5W) INTRAVENOUS SOLUTION
20.0000 mg/kg | INTRAVENOUS | Status: AC
Start: 2014-08-08 — End: 2014-08-08
  Administered 2014-08-08: 1640 mg via INTRAVENOUS
  Filled 2014-08-08: qty 16.4

## 2014-08-08 MED ORDER — LORAZEPAM 2 MG/ML INJECTION SOLUTION
2.0000 mg | INTRAMUSCULAR | Status: AC
Start: 2014-08-08 — End: 2014-08-08
  Administered 2014-08-08: 2 mg via INTRAVENOUS
  Filled 2014-08-08: qty 1

## 2014-08-08 MED ORDER — DIVALPROEX 500 MG TABLET,DELAYED RELEASE
750.00 mg | DELAYED_RELEASE_TABLET | Freq: Every evening | ORAL | Status: DC
Start: 2014-08-08 — End: 2014-08-08
  Filled 2014-08-08: qty 1

## 2014-08-08 MED ORDER — LEVETIRACETAM 500 MG/5 ML INTRAVENOUS SOLUTION
1500.00 mg | INTRAVENOUS | Status: DC
Start: 2014-08-08 — End: 2014-08-08

## 2014-08-08 MED ORDER — LORAZEPAM 2 MG/ML INJECTION SOLUTION
2.00 mg | INTRAMUSCULAR | Status: AC
Start: 2014-08-08 — End: 2014-08-08
  Administered 2014-08-08: 2 mg via INTRAVENOUS
  Filled 2014-08-08: qty 1

## 2014-08-08 MED ORDER — KETOROLAC 30 MG/ML (1 ML) INJECTION SOLUTION
30.0000 mg | Freq: Four times a day (QID) | INTRAMUSCULAR | Status: AC | PRN
Start: 2014-08-08 — End: 2014-08-11
  Administered 2014-08-08: 30 mg via INTRAVENOUS
  Filled 2014-08-08: qty 1

## 2014-08-08 MED ORDER — LORAZEPAM 2 MG/ML INJECTION SOLUTION
2.0000 mg | INTRAMUSCULAR | Status: AC
Start: 2014-08-08 — End: 2014-08-08
  Filled 2014-08-08: qty 1

## 2014-08-08 MED ORDER — LEVETIRACETAM 500 MG TABLET
1500.00 mg | ORAL_TABLET | Freq: Two times a day (BID) | ORAL | Status: DC
Start: 2014-08-08 — End: 2014-08-12
  Administered 2014-08-08 – 2014-08-12 (×8): 1500 mg via ORAL
  Filled 2014-08-08 (×9): qty 3

## 2014-08-08 MED ORDER — LEVETIRACETAM 500 MG/5 ML INTRAVENOUS SOLUTION
1000.0000 mg | INTRAVENOUS | Status: DC
Start: 2014-08-08 — End: 2014-08-08

## 2014-08-08 NOTE — Care Management Notes (Signed)
Pt educated on observation hospital status and given handout. Pt directed to phone numbers at the bottom of handout for any additional questions.

## 2014-08-08 NOTE — Care Management Notes (Addendum)
Care Coordinator/Social Work Plan  [facility]  Patient Name: [name] . Rubie MaidMIKITA   MRN: [id number]   Acct Number: [id number]  DOB: 1985/07/20 Age: [age]  **Admission Information**  Patient Type: OBSERVATION  Admit Date: 08/07/2014 Admit Time: 17:17  Admit Reason: Unspecified convulsions  Admitting Phys: Georges MouseSULTAN,SHUMAILA   Attending Phys: Georges MouseSULTAN,SHUMAILA   Unit: 9E Bed: 968-A  160. LOC Notification, CMS Important Message / Detailed Notice  Created by : Purvis KiltsAshley Davis Date/Time 2014-08-08 11:56:11.000  Medical Necessity and Level Of Care Notification  Level of Care Notification Patient notified of Level of Care status change to Observation (Date / Time) OBS LETTER GIVEN  @ 1101 TO PT & FRIEND

## 2014-08-08 NOTE — Nurses Notes (Signed)
EEG shows pt seizing Dr. Julius BowelsPollock at bedside ordered  Ativan 2mg  IV and Depacon IVPB given per orders.

## 2014-08-08 NOTE — Nurses Notes (Signed)
Patient head turned to the right, patient was grabbing at bedside table, and staring. Patient talking to brother while this was occuring. This lasted about 15 seconds.

## 2014-08-08 NOTE — Nurses Notes (Signed)
0900 am brother hit the event button 2 more times for the same thing head turns right left arm draws up pt awake and talking following commands.

## 2014-08-08 NOTE — Nurses Notes (Signed)
Pt requesting stronger pain medicine. Pain 10/10. Doesn't want tylenol. Service paged.

## 2014-08-08 NOTE — Nurses Notes (Signed)
Patient head turned to the right, patient was grabbing at side rail, and staring. Patient talking to brother while this was occuring. This lasted about 15 seconds.

## 2014-08-08 NOTE — Care Plan (Signed)
Problem: General Plan of Care(Adult,OB)  Goal: Plan of Care Review(Adult,OB)  The patient and/or their representative will communicate an understanding of their plan of care   Outcome: Ongoing (see interventions/notes)  Patient resting with brother at bedside. VEEG in place. Patient complaining of 8/10 headache pain, tylenol given. Sitter select in place as a safety precaution. Will continue to monitor.     Problem: Fall Risk (Adult)  Goal: Identify Related Risk Factors and Signs and Symptoms  Related risk factors and signs and symptoms are identified upon initiation of Human Response Clinical Practice Guideline (CPG)   Outcome: Ongoing (see interventions/notes)  Goal: Absence of Falls  Patient will demonstrate the desired outcomes by discharge/transition of care.   Outcome: Ongoing (see interventions/notes)

## 2014-08-08 NOTE — Nurses Notes (Signed)
EEG shows pt continues to seize Dr. Julius BowelsPollock ordered another Ativan 2mg  IV and Kepra IVPB given per orders.

## 2014-08-09 LAB — AST (SGOT): AST (SGOT): 18 U/L (ref 8–48)

## 2014-08-09 LAB — CBC/DIFF
BASOPHILS: 1 %
BASOS ABS: 0.068 10*3/uL (ref 0.000–0.200)
EOS ABS: 0.286 10*3/uL (ref 0.000–0.500)
EOSINOPHIL: 3 %
HCT: 43.6 % (ref 36.7–47.0)
HCT: 43.6 % (ref 36.7–47.0)
HGB: 14.4 g/dL (ref 12.5–16.3)
LYMPHOCYTES: 35 %
LYMPHS ABS: 3.026 THOU/uL (ref 1.000–4.800)
MCH: 28.8 pg (ref 27.4–33.0)
MCHC: 33.1 g/dL (ref 32.5–35.8)
MCV: 86.9 fL (ref 78–100)
MONOCYTES: 8 %
MONOS ABS: 0.702 10*3/uL (ref 0.300–1.000)
MPV: 8.3 fL (ref 7.5–11.5)
PLATELET COUNT: 275 10*3/uL (ref 140–450)
PMN ABS: 4.682 THOU/uL (ref 1.500–7.700)
PMN'S: 53 %
RBC: 5.02 MIL/uL (ref 4.06–5.63)
RDW: 13.9 % (ref 12.0–15.0)
WBC: 8.8 10*3/uL (ref 3.5–11.0)

## 2014-08-09 LAB — VALPROIC ACID LEVEL
TIME OF LAST DOSE: 2103 hr
VALPROIC ACID: 68 ug/mL (ref 50–100)

## 2014-08-09 LAB — BASIC METABOLIC PANEL
ANION GAP: 11 mmol/L (ref 4–13)
BUN/CREAT RATIO: 30 — ABNORMAL HIGH (ref 6–22)
BUN: 26 mg/dL — ABNORMAL HIGH (ref 8–25)
CALCIUM: 9.6 mg/dL (ref 8.5–10.4)
CARBON DIOXIDE: 24 mmol/L (ref 22–32)
CHLORIDE: 104 mmol/L (ref 96–111)
CREATININE: 0.87 mg/dL (ref 0.62–1.27)
ESTIMATED GLOMERULAR FILTRATION RATE: >59 mL/min/{1.73_m2} (ref >59)
GLUCOSE,NONFAST: 83 mg/dL (ref 65–139)
POTASSIUM: 4 mmol/L (ref 3.5–5.1)
SODIUM: 139 mmol/L (ref 136–145)

## 2014-08-09 LAB — ALT (SGPT): ALT (SGPT): 22 U/L (ref <55)

## 2014-08-09 LAB — MAGNESIUM: MAGNESIUM: 2.1 mg/dL (ref 1.6–2.5)

## 2014-08-09 LAB — PHOSPHORUS: PHOSPHORUS: 4.2 mg/dL (ref 2.4–4.7)

## 2014-08-09 MED ORDER — LORAZEPAM 2 MG/ML INJECTION SOLUTION
2.0000 mg | INTRAMUSCULAR | Status: AC
Start: 2014-08-09 — End: 2014-08-09
  Administered 2014-08-09: 2 mg via INTRAVENOUS
  Filled 2014-08-09: qty 1

## 2014-08-09 MED ORDER — PHENYTOIN SODIUM EXTENDED 100 MG CAPSULE
400.0000 mg | ORAL_CAPSULE | ORAL | Status: AC
Start: 2014-08-09 — End: 2014-08-09
  Administered 2014-08-09: 400 mg via ORAL
  Filled 2014-08-09: qty 4

## 2014-08-09 MED ORDER — PHENYTOIN SODIUM 50 MG/ML INTRAVENOUS SOLUTION
20.0000 mg/kg | INTRAVENOUS | Status: AC
Start: 2014-08-09 — End: 2014-08-09
  Administered 2014-08-09: 1325 mg via INTRAVENOUS
  Filled 2014-08-09: qty 26.5

## 2014-08-09 MED ORDER — SODIUM CHLORIDE 0.9 % INTRAVENOUS SOLUTION
200.00 mg | INTRAVENOUS | Status: DC
Start: 2014-08-09 — End: 2014-08-09
  Filled 2014-08-09: qty 20

## 2014-08-09 MED ORDER — DIVALPROEX 500 MG TABLET,DELAYED RELEASE
1000.00 mg | DELAYED_RELEASE_TABLET | Freq: Two times a day (BID) | ORAL | Status: DC
Start: 2014-08-09 — End: 2014-08-12
  Administered 2014-08-09 – 2014-08-12 (×6): 1000 mg via ORAL
  Filled 2014-08-09 (×7): qty 2

## 2014-08-09 MED ORDER — SODIUM CHLORIDE 0.9 % INTRAVENOUS SOLUTION
100.00 mg | Freq: Two times a day (BID) | INTRAVENOUS | Status: DC
Start: 2014-08-09 — End: 2014-08-09
  Filled 2014-08-09: qty 10

## 2014-08-09 MED ORDER — PHENYTOIN SODIUM EXTENDED 100 MG CAPSULE
100.0000 mg | ORAL_CAPSULE | Freq: Three times a day (TID) | ORAL | Status: DC
Start: 2014-08-09 — End: 2014-08-12
  Administered 2014-08-10 – 2014-08-12 (×7): 100 mg via ORAL
  Filled 2014-08-09 (×10): qty 1

## 2014-08-09 MED ORDER — LACOSAMIDE 200 MG/20 ML INTRAVENOUS SOLUTION
100.00 mg | Freq: Two times a day (BID) | INTRAVENOUS | Status: DC
Start: 2014-08-09 — End: 2014-08-09

## 2014-08-09 MED ORDER — DEXTROSE 5 % IN WATER (D5W) INTRAVENOUS SOLUTION
500.00 mg | INTRAVENOUS | Status: AC
Start: 2014-08-09 — End: 2014-08-09
  Administered 2014-08-09: 500 mg via INTRAVENOUS
  Filled 2014-08-09: qty 5

## 2014-08-09 NOTE — Discharge Summary (Signed)
DISCHARGE SUMMARY      PATIENT NAMNoah [name]:  [name],[name]  MRN:  [id number]  DOB:  04-06-1986    ADMISSION DATE:  08/04/2014  DISCHARGE DATE:  08/04/2014    ATTENDING PHYSICIAN: Dr. Norva RiffleGauri Pawar  PRIMARY CARE PHYSICIAN: No Established Pcp     DISCHARGE DIAGNOSIS:   Principle Problem: <principal problem not specified>  Active Hospital Problems    Diagnosis Date Noted    Seizure 08/04/2014      Resolved Hospital Problems    Diagnosis    No resolved problems to display.     Active Non-Hospital Problems    Diagnosis Date Noted    Spells 08/07/2014    Cerebral palsy 08/07/2014    Bipolar depression 08/07/2014    Anxiety 08/07/2014      No Known Allergies  DISCHARGE MEDICATIONS:     Current Discharge Medication List      CONTINUE these medications - NO CHANGES were made during your visit.       Details    ALPRAZolam 1 mg Tablet   Commonly known as:  XANAX    1 mg, Oral, 2 TIMES DAILY PRN   Refills:  0       divalproex 500 mg Tablet, Delayed Release (E.C.)   Commonly known as:  DEPAKOTE    500 mg, Oral, EVERY EVENING   Refills:  0       LevETIRAcetam 750 mg Tablet   Commonly known as:  KEPPRA    2,000 mg, Oral, EVERY EVENING   Refills:  0       multivitamin Tablet    1 Tab, Oral, DAILY   Refills:  0       risperiDONE 1 mg Tablet   Commonly known as:  RISPERDAL    1 mg, Oral, EVERY EVENING   Refills:  0           DISCHARGE INSTRUCTIONS:        Follow-up Information     Follow up with Neurology Clinic, [facility] .    Specialty:  Neurology    Contact information:    [address] [facility] [address]  [place] [id number]  [phone]    Additional information:    For driving directions to the Neurology Clinic, located within the [facility], [place],  please call [phone] or you may visit our website at [email or web address]*Valet parking is available to patients at [facility] outpatient clinics for free and tipping is not required.*Visitors to our main campus will Radio producernotice construction as  we are expanding to better serve you. We apologize for any inconvenience this may cause and appreciate your patience.          SCHEDULE FOLLOW-UP NEUROLOGY - PHYSICIAN OFFICE CENTER   Follow-up in: 3 MONTHS    Reason for visit: HOSPITAL DISCHARGE    Followup reason: spells, rule out seizure    Provider: Dr. Reymundo PollHatfield, neurology clinic           REASON FOR HOSPITALIZATION AND HOSPITAL COURSE:  This is a 29 y.o., male with past medical history of cerebral palsy, bipolar depression, and anxiety who presents as a transfer from an outside facility for the evaluation of spells. Per report from the outside facility there was a high suspicion that these spells could be non-epileptic, and the patient was admitted to be placed under video EEG monitoring. Immediately after orders were placed, however the patient refused to have an IV placed and became combative towards staff requiring security to  be called. The patient specifically asked to have his medications administered "through the butt" instead of having an IV placed. The patient chose to leave the hospital AMA instead of having an IV placed, therefore he was discharged AMA before any additional workup could be completed.     CONDITION ON DISCHARGE:  A. Ambulation: Full ambulation  B. Self-care Ability: Complete  C. Cognitive Status Alert and Oriented x 3  D. DNR status at discharge: Full Code    DISCHARGE DISPOSITION:  Home discharge      NEUROLOGY RISK FACTORS:  -None of the following conditions apply    Hillery Hunter, MD  08/09/2014, 13:50    Copies sent to Care Team       Relationship Specialty Notifications Start End    Pcp, No Established PCP - General   08/04/14           Referring providers can utilize [email or web address] to access their referred [employer] patient's information.

## 2014-08-09 NOTE — Care Plan (Signed)
Problem: General Plan of Care(Adult,OB)  Goal: Plan of Care Review(Adult,OB)  The patient and/or their representative will communicate an understanding of their plan of care   Outcome: Ongoing (see interventions/notes)  At shift change shift svc here stating pt to get loading dose dilantin ivpb per report from dayshift svc notified pt only had peripheral H/L placed today by stat RN approx 1 hr after staring pt yelling out dilantin hurting burning no s/s of infiltrate after 3 times stoppimg starting svc notified and came and saw pt and brother svc stated to slow IVPB  to as slow as possible which is now running at 27ml dilantin level to be drawn 2 hrs after completion per svc will notify next shift in report  Goal: Individualization/Patient Specific Goal(Adult/OB)  Outcome: Ongoing (see interventions/notes)    Problem: Fall Risk (Adult)  Goal: Identify Related Risk Factors and Signs and Symptoms  Related risk factors and signs and symptoms are identified upon initiation of Human Response Clinical Practice Guideline (CPG)   Outcome: Ongoing (see interventions/notes)  Goal: Absence of Falls  Patient will demonstrate the desired outcomes by discharge/transition of care.   Outcome: Ongoing (see interventions/notes)

## 2014-08-09 NOTE — Nurses Notes (Signed)
1000 patient having seizure like activity on video EEG.    1015 Ativan 2 mg IV administered. Will monitor.

## 2014-08-09 NOTE — Nurses Notes (Signed)
1454 patient medicated with ativan 2 mg IV for seizure like activity. Patient is awake and alert, states he cannot tell he is having any seizure activity. Family at bedside, note seizure like activity in patient.

## 2014-08-09 NOTE — Care Management Notes (Signed)
[facility] Management Initial Evaluation    Patient Name: [name]  Date of Birth: 1985/11/22  Sex: male  Date/Time of Admission: 08/07/2014  5:17 PM  Room/Bed: 968/A  Payor: MEDICAID [place] / Plan: [place] / Product Type: Medicaid Out of State /   PCP: No Established Pcp    Pharmacy Info:   Preferred [facility], [place]    [address] [address]    Phone: [phone] Fax: [phone]    Open 24 Hours?: No        Emergency Contact Info:   Extended Emergency Contact Information  Primary Emergency Contact: [name]           [place] Phone: [phone]  Relation: Brother  Secondary Emergency Contact: [name]  Address: [address] [place] [address] [place] Phone: [phone]  Relation: Mother    History:   [name] is a 29 y.o., male, admitted as transfer from [facility].    Height/Weight: 170.2 cm (_0 ) / 106.3 kg (234 lb 5.6 oz)       Admitting Diagnosis: Spells [R56.9]  Spells [R56.9]    Assessment:      08/09/14 1337   Assessment Details   Assessment Type Admission   Date of Care Management Update 08/09/14   Date of Next DCP Update 08/11/14   Care Management Plan   Discharge Planning Status initial meeting   CM will evaluate for rehabilitation potential yes   Discharge Needs Assessment   Equipment Currently Used at Home none   Equipment Needed After Discharge other (see comments)  (TBD)   Discharge Facility/Level Of Care Needs Home (Patient/Family Member/other)(code 1)   Transportation Available car;family or friend will provide   Referral Information   Admission Type observation   Address Verified verified-no changes   Arrived From acute hospital, other  [facility])   [place] Verified verified-no change   ADVANCE DIRECTIVES   Does the Patient have an Advance Directive? No,  Information Offered and Given   Patient Requests Assistance in Having Advance Directive Notarized. Yes   Employment/Financial   Patient has Prescription Coverage?  Yes       Name of Insurance Coverage for Medications [place] an age group to open "lives with" row.  Adult   Lives With spouse   Living Arrangements house  (1 LEVEL)   Able to Return to Prior Living Arrangements yes   Home Safety   Home Assessment: No Problems Identified   Home Accessibility stairs to enter home;tub/shower is not walk in   [place]   Number of Stairs to [place] 1   per chart review, pt is a 29 y.o., White malewith past medical history of cerebral palsy, bipolar depression, and anxiety who presents as a transfer from [facility] for the evaluation of spells. Pt recently admitted 4/13 but left AMA. Pt has been to 8 other hospitals for eval.     Status   Current status A&O x 4 pt with Symptomatic Epilepsy / Focal Dyscognative Seizures appears lethargic today. Pt fell asleep during discussion, brother at bedside and completed review with this ccc. Pt had two witnessed seizures  this am and VEEG= + seizure activity. Labs & VSS. LBM - 4/17     Discharge Plan:  Home (Patient/Family Member/other) (code 1)  This [name] met with pt and brother at bedside to discuss D/C plan. Pt demographics, insurance info and home environment assessed. Pt lives w/ wife in one level home w/ one step in.  Pt has script coverage and transportation. There is good support that is willing and able to provide care, brother, wife and mother. Pt independent prior to admission, does not drive or work. No active HH or DME.  At this time pt receiving ongoing eval and tx toward improving pt's status.      Service plan :     Continue VEEG  Possible adjustment in medication    Pt is anticipated to d/c pending further hospital course.    Care Management office contact information given to pt at this time via handout.  Pt has been educated on MPOA and healthcare surrogate, offered and given. [name] [facility] aware that MPOA needs notarized.   Will con't to assess for readiness to D/C and assist with D/C when appropriate.    Case Manager: Teodora Medici, RN 08/09/2014, 13:41  Phone: [phone] Weekend/ Monday Float [place]

## 2014-08-09 NOTE — Progress Notes (Addendum)
[facility]  Neurology Progress Note      [name],[name], 29 y.o. male  Date of Admission:  08/07/2014  Date of service: 08/10/2014  Date of Birth:  1985/07/21      Chief Complaint: Spells  Pt's condition today: stable      Subjective: Patient was seen and examined. Requested to leave AMA last night. Remains on VEEG. Patient very drowsy this morning. Brother reports decrease in seizure frequency. Patient ripped off VEEG leads. Requesting to leave.    Vital Signs:  Temp (24hrs) Max:36.9 C (98.4 F)      Systolic (24hrs), Avg:106 mmHg, Min:91 mmHg, Max:123 mmHg    Diastolic (24hrs), Avg:69 mmHg, Min:58 mmHg, Max:75 mmHg    Temp  Avg: 36.6 C (97.8 F)  Min: 36.1 C (97 F)  Max: 36.9 C (98.4 F)  Pulse  Avg: 81.2  Min: 59  Max: 97  Resp  Avg: 18.3  Min: 18  Max: 20  SpO2  Avg: 97 %  Min: 97 %  Max: 97 %  Pain Score (Numeric, Faces): 3    Today's Physical Exam:    General: drowsy  Mental status: Oriented x 3  Memory: Registration, Recall, and Following of commands is normal  Attention: Attention and Concentration are decreased  Knowledge: ok  Language and Speech: Normal and Normal  Cranial nerves: Cranial nerves 2-12 are normal  Muscle tone: WNL  Motor strength:  Motor strength is normal throughout.  Sensory: Sensory exam in the upper and lower extremities is normal  Coordination: Coordination is normal without tremor  Reflexes: Reflexes are 2/2 throughout      Current Medications:    Current Facility-Administered Medications:  acetaminophen (TYLENOL) tablet 650 mg Oral Q4H PRN   ALPRAZolam (XANAX) tablet 1 mg Oral 2x/day PRN   divalproex (DEPAKOTE) 12 hr delayed release tablet 1,000 mg Oral 2x/day   enoxaparin PF (LOVENOX) 40 mg/0.4 mL SubQ injection 40 mg Subcutaneous Q24H   ketorolac (TORADOL) 30 mg/mL injection 30 mg Intravenous Q6H PRN   levETIRAcetam (KEPPRA) tablet 1,500 mg Oral 2x/day   multivitamin tablet 1 Tab Oral Daily   nicotine (NICODERM CQ) transdermal patch (mg/24 hr) 21 mg Transdermal Daily     phenytoin sodium extended release (DILANTIN) capsule 100 mg Oral 3x/day       I/O:  I/O last 24 hours:      Intake/Output Summary (Last 24 hours) at 08/10/14 0643  Last data filed at 08/10/14 0200   Gross per 24 hour   Intake    240 ml   Output    700 ml   Net   -460 ml     I/O current shift:       Labs  Please indicate ordered or reviewed)  Reviewed: I have reviewed all lab results.      Independent Interpretation of images or specimens:  None new to review    Assessment/Plan:  Active Hospital Problems    Diagnosis    Spells    Cerebral palsy    Bipolar depression    Anxiety    Seizure         Patient is a 30 year old male with PMH of epilepsy on Keppra and Depakote, cerebral palsy, bipolar depression, and anxiety who presents as a transfer from an outside facility for the evaluation of seizures.    Symptomatic Epilepsy / Focal Dyscognative Seizures     - VEEG: Patient had 63 clinical seizures in the first 24-hours of recording.   -  Loaded with Dilantin yesterday. Dilantin level is low at 6.0 this morning.   - Continue Dilantin 100 mg TID.   - Continue  Keppra 1500 mg BID and Depakote to 1000 mg BID  - Depakote trough level this morning was 74.   - Recommend to continue VEEG.     Depression    -- Patient requesting to speak with psychiatry. Will consult psych for recs. Appreciate input.       DVT/PE Prophylaxis: Lovenox       Romona CurlsBenjamin Bradley Pollock, DO 08/10/2014, 06:43       I saw and examined the patient.  I reviewed the resident's note.  I agree with the findings and plan of care as documented in the resident's note.  Any exceptions/additions are edited/noted.    Georges MouseShumaila Mcguire Gasparyan, MD 08/10/2014, 12:45

## 2014-08-09 NOTE — Nurses Notes (Signed)
svc notified pt c/o ivbp dilantin again hurting/burning rate 27ml hour, pt had approx 75ml left of 250 ml bag stated after checking with pharm to stop dilantin ivbp and would order dose of po next shift aware and also of 2hr after dilantin level to be drawn

## 2014-08-09 NOTE — Care Plan (Signed)
Problem: General Plan of Care(Adult,OB)  Goal: Plan of Care Review(Adult,OB)  The patient and/or their representative will communicate an understanding of their plan of care   Outcome: Ongoing (see interventions/notes)  Discharge Plan:  Home (Patient/Family Member/other) (code 1)  This CCC met with pt and brother at bedside to discuss D/C plan. Pt demographics, insurance info and home environment assessed. Pt lives w/ wife in one level home w/ one step in.  Pt has script coverage and transportation. There is good support that is willing and able to provide care, brother, wife and mother. Pt independent prior to admission, does not drive or work. No active HH or DME.  At this time pt receiving ongoing eval and tx toward improving pt's status.      Service plan :     Continue VEEG  Possible adjustment in medication    Pt is anticipated to d/c pending further hospital course.    Care Management office contact information given to pt at this time via handout. Pt has been educated on MPOA and healthcare surrogate, offered and given. Roxanna M[facility] aware that MPOA needs notarized.   Will con't to assess for readiness to D/C and assist with D/C when appropriate.

## 2014-08-09 NOTE — Progress Notes (Addendum)
[facility]  Interval/Cross Coverage Note    [name]  Date of service: 08/09/2014    Interval/Cross coverage update    Patient having more seizures despite increased dose of Depakote and Keppra. Will add Dilantin, 20 mg/kg load now and start maintenance 100 mg TID.      Romona CurlsBenjamin Bradley Pollock, DO 08/09/2014, 14:48

## 2014-08-09 NOTE — Procedures (Signed)
[facility]                                ELECTROENCEPHALOGRAM REPORT                                EEG\EMG Scheduling [phone]                                 STATUS: I      NAMNoah Delaine:  [name], Jousha   UJWJ#:[id number]#:[id number]  DATE: 08/08/2014  DOB :  January 23, 1986  SEX:M                  EEG #:  [id number].  Technician:  JS.      REQUESTING PHYSICIAN:  Georges MouseShumaila Sultan, MD.     DATE AND TIME:  August 08, 2014, 12:20 a.m. to August 09, 2014, 12:20 a.m., day 1.      HISTORY:  This is a 29 year old male with a history of seizures admitted for spell characterization.    REPORT:  This is a digitally acquired EEG performed with the standard 10-20 system of electrodes that ran for approximately 24 hours with the timing described above.  The background of the EEG showed 7-8 Hz, symmetric, posterior dominant rhythm.  Recorded less frequent right temporal epileptiform discharges.  On a single occasion, poorly characterized left temporal sharp waves was seen which is of unclear significance.  Sleep was recorded with vertex spindles and K complexes.  During this monitoring, recorded multiple seizures with clinical correlation.  The first 12 hours of the recorded approximately 30 seizures and the last 12 hours of the recording recorded approximately 33 seizures.  All these seizures were very stereotypical in clinical an EEG onset.  During the onset of the seizures, the patient's family reported that the patient stared, at times had a pause in his action, but in some of them he was able to communicate.  In all the seizures, at the onset the patient was noted to have nonforced head turn to the right followed by right arm half extended or being lifted up.  He appears to be agitated during these events.  On the EEG, the onset was poorly localized, but noted to have low amplitude faster frequency rhythms seen diffusely  that evolves in frequency with higher amplitude and are more notable on the  right frontal with some sharply contoured waves forms, followed by diffuse delta activity.  These seizures lasted approximately 1-2 seconds in duration.  Digital analysis of spike detection revealed above-mentioned seizures and spikes.  The seizure frequencies were communicated intermittently to the team taking care of the patient for adjusting the antiepileptic medication.    INTERPRETATION:  This is a very abnormal EEG due to recorded multiple electrographic seizures as described in the report with poor localization and lateralization, but the subtle more rhythmic changes on the right hemisphere and evolution could suggest right hemispheric origin. Recorded  right temporal epileptiform discharges.  On a single occasion, poorly characterized left temporal sharp waves was seen which is of unclear significance.      Malva LimesVijayalakshmi Tahliyah Anagnos, MD  Assistant Professor  [facility] Department of Neurology    QM/VHQ/[id number]; D: 08/09/2014 28:41:[id number]:[id number]; T: 08/09/2014 23:20:52    cc: Georges MouseShumaila Sultan MD      Shirleen SchirmerINBASKET

## 2014-08-09 NOTE — Progress Notes (Addendum)
[facility]  Neurology Progress Note      [name],[name], 29 y.o. male  Date of Admission:  08/07/2014  Date of service: 08/09/2014  Date of Birth:  Aug 30, 1985      Chief Complaint: Spells  Pt's condition today: stable      Subjective: Patient was seen and examined.  Had multiple seizures yesterday captured on VEEG. Increased Depakote and Keppra yesterday. Complaint of pain overnight. Patient is very sleepy this morning. Once he was awake, patient had 2 seizures. Brother reports seizure frequency improved with change in medications. Patient stating he is leaving today.       Vital Signs:  Temp (24hrs) Max:36.7 C (98.1 F)      Systolic (24hrs), Avg:113 mmHg, Min:104 mmHg, Max:118 mmHg    Diastolic (24hrs), Avg:75 mmHg, Min:67 mmHg, Max:83 mmHg    Temp  Avg: 36.5 C (97.7 F)  Min: 36.4 C (97.5 F)  Max: 36.7 C (98.1 F)  Pulse  Avg: 85  Min: 77  Max: 98  Resp  Avg: 18  Min: 18  Max: 18  SpO2  Avg: 97.3 %  Min: 96 %  Max: 99 %  Pain Score (Numeric, Faces): 1    Today's Physical Exam:    General: lethargic   Mental status: Oriented x 3  Memory: Registration, Recall, and Following of commands is normal  Attention: Attention and Concentration are decreased  Knowledge: ok  Language and Speech: Normal and Normal  Cranial nerves: Cranial nerves 2-12 are normal  Muscle tone: WNL  Motor strength:  Motor strength is normal throughout.  Sensory: Sensory exam in the upper and lower extremities is normal  Coordination: Coordination is normal without tremor  Reflexes: Reflexes are 2/2 throughout      Current Medications:    Current Facility-Administered Medications:  acetaminophen (TYLENOL) tablet 650 mg Oral Q4H PRN   ALPRAZolam (XANAX) tablet 1 mg Oral 2x/day PRN   divalproex (DEPAKOTE) delayed release tablet 750 mg 750 mg Oral 2x/day   enoxaparin PF (LOVENOX) 40 mg/0.4 mL SubQ injection 40 mg Subcutaneous Q24H   ketorolac (TORADOL) 30 mg/mL injection 30 mg Intravenous Q6H PRN   levETIRAcetam (KEPPRA) tablet 1,500  mg Oral 2x/day   multivitamin tablet 1 Tab Oral Daily   nicotine (NICODERM CQ) transdermal patch (mg/24 hr) 21 mg Transdermal Daily       I/O:  I/O last 24 hours:      Intake/Output Summary (Last 24 hours) at 08/09/14 0706  Last data filed at 08/09/14 0300   Gross per 24 hour   Intake   1600 ml   Output    825 ml   Net    775 ml     I/O current shift:  04/18 0000 - 04/18 0759  In: 0   Out: 200 [Urine:200]    Labs  Please indicate ordered or reviewed)  Reviewed: I have reviewed all lab results.      Independent Interpretation of images or specimens:  None new to review    Assessment/Plan:  Active Hospital Problems    Diagnosis    Spells    Cerebral palsy    Bipolar depression    Anxiety    Seizure         Patient is a 29 year old male with PMH of epilepsy on Keppra and Depakote, cerebral palsy, bipolar depression, and anxiety who presents as a transfer from an outside facility for the evaluation of spells.    Symptomatic Epilepsy / Focal Dyscognative  Seizures     - VEEG captured several focal seizures with clinical correlate. Patient had 2 witnessed seizures this morning. Brother reports decreased seizure frequency since changing home AEDs.   - Increased home AEDs to  Keppra 1500 mg BID and  Depakote 750 mg BID  - Depakote trough level this morning was 68. Will increase Depakote to 1000 mg BID.   - AST and ALT WNL  - Continue VEEG.   - Will discuss adding Vimpat or Trileptal this morning with staff.       DVT/PE Prophylaxis: Lovenox    Romona CurlsBenjamin Bradley Pollock, DO 08/09/2014, 07:06       I saw and examined the patient.  I reviewed the resident's note.  I agree with the findings and plan of care as documented in the resident's note.  Any exceptions/additions are edited/noted.    Georges MouseShumaila Ying [place]ocks, MD 08/09/2014, 12:17

## 2014-08-09 NOTE — Care Plan (Signed)
Problem: General Plan of Care(Adult,OB)  Goal: Plan of Care Review(Adult,OB)  The patient and/or their representative will communicate an understanding of their plan of care   Outcome: Ongoing (see interventions/notes)  Pt AOx4. Fall, seizure, and aspiration precautions in place. Video EEG on. Brother at bedside. Pt well controlled with PRN meds. New order for Toradol given. Pt had no episodes throughout the night.Call bell in reach.    Goal: Individualization/Patient Specific Goal(Adult/OB)  Outcome: Ongoing (see interventions/notes)    Problem: Fall Risk (Adult)  Goal: Identify Related Risk Factors and Signs and Symptoms  Related risk factors and signs and symptoms are identified upon initiation of Human Response Clinical Practice Guideline (CPG)   Outcome: Ongoing (see interventions/notes)  Goal: Absence of Falls  Patient will demonstrate the desired outcomes by discharge/transition of care.   Outcome: Ongoing (see interventions/notes)

## 2014-08-10 DIAGNOSIS — F329 Major depressive disorder, single episode, unspecified: Secondary | ICD-10-CM

## 2014-08-10 DIAGNOSIS — F411 Generalized anxiety disorder: Secondary | ICD-10-CM

## 2014-08-10 DIAGNOSIS — F1210 Cannabis abuse, uncomplicated: Secondary | ICD-10-CM

## 2014-08-10 DIAGNOSIS — F17200 Nicotine dependence, unspecified, uncomplicated: Secondary | ICD-10-CM

## 2014-08-10 LAB — CBC/DIFF
BASOPHILS: 1 %
BASOS ABS: 0.102 10*3/uL (ref 0.000–0.200)
EOS ABS: 0.289 10*3/uL (ref 0.000–0.500)
EOSINOPHIL: 3 %
HCT: 41.7 % (ref 36.7–47.0)
HGB: 13.9 g/dL (ref 12.5–16.3)
LYMPHOCYTES: 29 %
LYMPHS ABS: 3.238 THOU/uL (ref 1.000–4.800)
MCH: 29.1 pg (ref 27.4–33.0)
MCHC: 33.4 g/dL (ref 32.5–35.8)
MCV: 87.3 fL (ref 78–100)
MONOCYTES: 7 %
MONOS ABS: 0.789 THOU/uL (ref 0.300–1.000)
MPV: 8.6 fL (ref 7.5–11.5)
PLATELET COUNT: 313 THOU/uL (ref 140–450)
PMN ABS: 6.651 THOU/uL (ref 1.500–7.700)
PMN'S: 60 %
RBC: 4.77 MIL/uL (ref 4.06–5.63)
RDW: 13.9 % (ref 12.0–15.0)
WBC: 11.1 THOU/uL — ABNORMAL HIGH (ref 3.5–11.0)

## 2014-08-10 LAB — BASIC METABOLIC PANEL
ANION GAP: 10 mmol/L (ref 4–13)
BUN/CREAT RATIO: 26 — ABNORMAL HIGH (ref 6–22)
BUN: 23 mg/dL (ref 8–25)
CALCIUM: 9.1 mg/dL (ref 8.5–10.4)
CARBON DIOXIDE: 25 mmol/L (ref 22–32)
CHLORIDE: 104 mmol/L (ref 96–111)
CREATININE: 0.88 mg/dL (ref 0.62–1.27)
ESTIMATED GLOMERULAR FILTRATION RATE: >59 ml/min/1.73m2 (ref >59)
GLUCOSE,NONFAST: 95 mg/dL (ref 65–139)
POTASSIUM: 4.2 mmol/L (ref 3.5–5.1)
SODIUM: 139 mmol/L (ref 136–145)

## 2014-08-10 LAB — VALPROIC ACID LEVEL: TIME OF LAST DOSE: 2100 hr

## 2014-08-10 LAB — PHENYTOIN
PHENYTOIN: 6 ug/mL — ABNORMAL LOW (ref 10–20)
PHENYTOIN: 6.4 ug/mL — ABNORMAL LOW (ref 10–20)
TIME DRAWN:: 2300 h
TIME DRAWN:: 530 hr

## 2014-08-10 LAB — MAGNESIUM: MAGNESIUM: 2 mg/dL (ref 1.6–2.5)

## 2014-08-10 LAB — PHOSPHORUS: PHOSPHORUS: 4 mg/dL (ref 2.4–4.7)

## 2014-08-10 MED ADMIN — LACTATED RINGERS W/ ADDITIVES: ORAL | @ 11:00:00 | NDC 00338011704

## 2014-08-10 MED ADMIN — ipratropium 0.5 mg-albuteroL 3 mg (2.5 mg base)/3 mL nebulization soln: ORAL | @ 21:00:00

## 2014-08-10 NOTE — Care Management Notes (Signed)
Met with Pt to assist in completion of MPOA. Pt appeared agitated on arrival to room. Pt grabbed board out of my hand and started scratching names off of form he had previously completed. He states he doesn't want these people on anything. Notified him he was not required to do form and he tossed it back to me and said good. Notified nursing of status. Will follow and assist as needed,

## 2014-08-10 NOTE — Nurses Notes (Signed)
Patient states that he wants to leave AMA. Pt and his brother argued and brother left. When asked why he wants to leave, patient says that he must leave because his brother was his ride. Reminded patient that he is here for seizure monitoring and that it is important that he stays. [facility]  Notified Dr Clovis CaoEric Seachrist of patient's wanting to leave AMA. Dr Alan RipperSeachrist will come see patient. After talking with patient, patient agrees to stay. Notified Dr Alan RipperSeachrist.  2131- patient's brother returned.
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
## 2014-08-10 NOTE — Care Plan (Signed)
Problem: General Plan of Care(Adult,OB)  Goal: Plan of Care Review(Adult,OB)  The patient and/or their representative will communicate an understanding of their plan of care   Outcome: Ongoing (see interventions/notes)  Patient upset earlier today, pulled IV and removed all EEG leads, stated he was leaving. Service in and spoke with patient regarding the possible danger related with current seizure activity and that they would like to try and stay at least one more day. Patient soon after spoke with his wife, she talked him into staying at least one more day. New IV placed by stat nurse and patient was than wired to video EEG.  Goal: Individualization/Patient Specific Goal(Adult/OB)  Outcome: Ongoing (see interventions/notes)    Problem: Fall Risk (Adult)  Goal: Identify Related Risk Factors and Signs and Symptoms  Related risk factors and signs and symptoms are identified upon initiation of Human Response Clinical Practice Guideline (CPG)   Outcome: Ongoing (see interventions/notes)  Goal: Absence of Falls  Patient will demonstrate the desired outcomes by discharge/transition of care.   Outcome: Ongoing (see interventions/notes)

## 2014-08-11 LAB — PHENYTOIN
PHENYTOIN: 7.5 ug/mL — ABNORMAL LOW (ref 10–20)
PHENYTOIN: 7.7 ug/mL — ABNORMAL LOW (ref 10–20)
TIME DRAWN:: 530 h

## 2014-08-11 LAB — PHOSPHORUS
PHOSPHORUS: 4.1 mg/dL (ref 2.4–4.7)
PHOSPHORUS: 4.1 mg/dL (ref 2.4–4.7)

## 2014-08-11 LAB — CBC/DIFF
BASOPHILS: 1 %
BASOS ABS: 0.055 THOU/uL (ref 0.000–0.200)
EOS ABS: 0.244 THOU/uL (ref 0.000–0.500)
EOSINOPHIL: 3 %
HCT: 41.7 % (ref 36.7–47.0)
HGB: 13.9 g/dL (ref 12.5–16.3)
LYMPHOCYTES: 32 %
LYMPHS ABS: 3.158 10*3/uL (ref 1.000–4.800)
MCH: 28.8 pg (ref 27.4–33.0)
MCHC: 33.5 g/dL (ref 32.5–35.8)
MCV: 86.1 fL (ref 78–100)
MONOCYTES: 7 %
MONOS ABS: 0.707 THOU/uL (ref 0.300–1.000)
MPV: 8.3 fL (ref 7.5–11.5)
PLATELET COUNT: 279 THOU/uL (ref 140–450)
PMN ABS: 5.74 THOU/uL (ref 1.500–7.700)
PMN'S: 57 %
RBC: 4.84 MIL/uL (ref 4.06–5.63)
RDW: 13.7 % (ref 12.0–15.0)
WBC: 9.9 10*3/uL (ref 3.5–11.0)

## 2014-08-11 LAB — BASIC METABOLIC PANEL
ANION GAP: 10 mmol/L (ref 4–13)
BUN/CREAT RATIO: 21 (ref 6–22)
BUN: 18 mg/dL (ref 8–25)
CALCIUM: 9.6 mg/dL (ref 8.5–10.4)
CARBON DIOXIDE: 27 mmol/L (ref 22–32)
CHLORIDE: 102 mmol/L (ref 96–111)
CREATININE: 0.86 mg/dL (ref 0.62–1.27)
ESTIMATED GLOMERULAR FILTRATION RATE: >59 ml/min/1.73m2 (ref >59)
GLUCOSE,NONFAST: 108 mg/dL (ref 65–139)
POTASSIUM: 3.8 mmol/L (ref 3.5–5.1)
SODIUM: 139 mmol/L (ref 136–145)

## 2014-08-11 LAB — AST (SGOT): AST (SGOT): 17 U/L (ref 8–48)

## 2014-08-11 LAB — VALPROIC ACID LEVEL: TIME OF LAST DOSE: 2100 hr

## 2014-08-11 LAB — ALT (SGPT): ALT (SGPT): 22 U/L (ref <55)

## 2014-08-11 LAB — MAGNESIUM: MAGNESIUM: 2.2 mg/dL (ref 1.6–2.5)

## 2014-08-11 MED ORDER — LACOSAMIDE 100 MG TABLET
100.00 mg | ORAL_TABLET | Freq: Two times a day (BID) | ORAL | Status: DC
Start: 2014-08-11 — End: 2014-08-12
  Administered 2014-08-11 – 2014-08-12 (×3): 100 mg via ORAL
  Filled 2014-08-11 (×3): qty 1

## 2014-08-11 MED ORDER — SODIUM CHLORIDE 0.9 % IV BOLUS
1000.0000 mL | INJECTION | Freq: Once | Status: AC
Start: 2014-08-11 — End: 2014-08-11
  Administered 2014-08-11: 1000 mL via INTRAVENOUS

## 2014-08-11 MED ORDER — PHENYTOIN SODIUM EXTENDED 100 MG CAPSULE
300.0000 mg | ORAL_CAPSULE | Freq: Once | ORAL | Status: AC
Start: 2014-08-11 — End: 2014-08-11
  Administered 2014-08-11: 300 mg via ORAL
  Filled 2014-08-11: qty 3

## 2014-08-11 MED ORDER — SODIUM CHLORIDE 0.9 % INTRAVENOUS SOLUTION
200.00 mg | INTRAVENOUS | Status: AC
Start: 2014-08-11 — End: 2014-08-11
  Administered 2014-08-11: 200 mg via INTRAVENOUS
  Filled 2014-08-11: qty 20

## 2014-08-11 MED ORDER — CLOBAZAM 10 MG TABLET
5.00 mg | ORAL_TABLET | Freq: Two times a day (BID) | ORAL | Status: DC
Start: 2014-08-11 — End: 2014-08-11
  Filled 2014-08-11: qty 1

## 2014-08-11 MED ORDER — PHENYTOIN SODIUM EXTENDED 100 MG CAPSULE
300.0000 mg | ORAL_CAPSULE | ORAL | Status: AC
Start: 2014-08-11 — End: 2014-08-11
  Administered 2014-08-11: 300 mg via ORAL
  Filled 2014-08-11: qty 3

## 2014-08-11 MED ORDER — PROPRANOLOL 10 MG TABLET
10.0000 mg | ORAL_TABLET | Freq: Three times a day (TID) | ORAL | Status: DC
Start: 2014-08-11 — End: 2014-08-11
  Administered 2014-08-11: 0 mg via ORAL
  Filled 2014-08-11 (×4): qty 1

## 2014-08-11 MED ADMIN — sodium chloride 0.9 % (flush) injection syringe: ORAL | @ 23:00:00

## 2014-08-11 MED ADMIN — HYDROcodone 5 mg-acetaminophen 325 mg tablet: ORAL | @ 09:00:00

## 2014-08-11 MED ADMIN — lacosamide 100 mg tablet: ORAL | @ 21:00:00

## 2014-08-11 MED ADMIN — divalproex 500 mg tablet,delayed release: ORAL | @ 21:00:00

## 2014-08-11 MED ADMIN — artificial tears with lanolin eye ointment: ORAL | @ 16:00:00 | NDC 09991000523

## 2014-08-11 NOTE — Care Plan (Signed)
Problem: General Plan of Care(Adult,OB)  Goal: Plan of Care Review(Adult,OB)  The patient and/or their representative will communicate an understanding of their plan of care   Outcome: Ongoing (see interventions/notes)  Patient calm and cooperative throughout night. Still hooked up to video EEG. No notable events occurred.         Problem: Fall Risk (Adult)  Goal: Identify Related Risk Factors and Signs and Symptoms  Related risk factors and signs and symptoms are identified upon initiation of Human Response Clinical Practice Guideline (CPG)   Outcome: Ongoing (see interventions/notes)  Goal: Absence of Falls  Patient will demonstrate the desired outcomes by discharge/transition of care.   Outcome: Ongoing (see interventions/notes)

## 2014-08-11 NOTE — Procedures (Signed)
[facility]                                ELECTROENCEPHALOGRAM REPORT                                EEG\EMG Scheduling [phone]                                 STATUS: I      NAMNoah Delaine:  [name], [name]   UJWJ#:[id number]#:[id number]  DATE: 08/10/2014  DOB :  03/02/1986  SEX:M                  Tech:  JS.  EEG #:  [id number].     REQUESTING PHYSICIAN:    Georges MouseShumaila Sultan, MD.    DATE AND TIME OF STUDY:  August 10, 2014, from 12:20 a.m. to August 11, 2014, at 12:16 a.m.      REPORT:  This is a digitally acquired EEG performed with the standard 10-20 system of electrodes that ran for approximately 20 hours' and 4 minutes' duration.  The study was briefly interrupted as the patient pulled all of his wires off.  The background of the EEG continued to show intermittent theta slowing.  The best background appreciated reached up to 8 Hz.  Sleep was recorded with vertex spindles and K complexes.  The first 8 hours of the recording captured approximately 13 seizures.  All of these seizures were stereotypical as described in the previous report.  The patient was noted to have staring and pausing of action.  He answered appropriately in a few of the seizures.  He was noted to be agitated during these events.  On the EEG, the onset had poor localization.  There was a clear change in background rhythm that was characterized by diffuse low-amplitude slowing with subtle faster frequency rhythms noted more so on the midline region.  These evolved in frequency with theta and delta slowing.  The delta amplitude was higher on the left hemisphere when compared to the right hemisphere.  The last 12 hours of the recording captured approximately about 4 seizures.  The last one was approximately August 10, 2014, around [place].  The seizure semiology and EEG were similar, as described above.  In addition, the patient was also noted to have rare right temporal spikes occasionally with wider distribution.  In  addition, at baseline, this record also showed asymmetry with lower amplitude over the right hemisphere.    INTERPRETATION:  This is an abnormal EEG due to recorded seizures with poor localization and lateralization, but overall when compared to the previous day's recording, the seizure frequency showed significant improvement, especially in the last 12 hours.  In addition, also recorded epileptiform discharges from the right temporal and diffuse slowing being worse with lower amplitude on the right hemisphere.      Malva LimesVijayalakshmi Deysha Cartier, MD  Assistant Professor  [facility] of Neurology    QM/VHQ/[id number]; D: 08/11/2014 22:37:06; T: 08/11/2014 23:22:54    cc: Georges MouseShumaila Sultan MD      Shirleen SchirmerINBASKET

## 2014-08-11 NOTE — Nurses Notes (Signed)
Patient morning blood pressure 89/56. Neurology service 1 notified. Page returned. No orders at this time.

## 2014-08-11 NOTE — Care Management Notes (Signed)
[facility]  Care Management Note    Patient Name: [name]  Date of Birth: Aug 07, 1985  Sex: male  Date/Time of Admission: 08/07/2014  5:17 PM  Room/Bed: 968/A  Payor: [place]ICA[place] OUT OF STATE MISC / Plan: [place] [employer] / Product Type: Medicaid Out of State /    LOS: 2 days   PCP: No Established Pcp    Admitting Diagnosis:  Spells [R56.9]  Spells [R56.9]  Spells [R56.9]    Assessment:      08/11/14 1320   Assessment Details   Assessment Type Continued Assessment   Date of Care Management Update 08/11/14   Date of Next DCP Update 08/13/14   Care Management Plan   Discharge Planning Status initial meeting   Projected Discharge Date 08/11/14   VEEG: Patient continues to have >50 seizures despite 3 AED's  Psych Recommendations:   -SSRI's and Latuda were not effective in the past.  -Discussed other options, however states he does not want any "of those medications" at this time.  -Recommend adding Inderal 10 mg TID (for anxiety) if he is agreeable. Patient said he will consider Inderal.   -Will continue to discuss treatment options with patient, build a therapeutic alliance, and offer support.   -Will be staffed tomorrow.       Discharge Plan:  Home (Patient/Family Member/other) (code 1)  D/C plan remains to return home with family transport. Will follow and assist with d/c needs as identified.     The patient will continue to be evaluated for developing discharge needs.     Case Manager: Milderd MeagerCarrie Kaelynne Christley, SW 08/11/2014, 13:20  Phone: [phone]

## 2014-08-11 NOTE — Procedures (Signed)
[facility]                                ELECTROENCEPHALOGRAM REPORT                                EEG\EMG Scheduling [phone]                                 STATUS: I      NAMNoah Delaine:  [name], [name]   UJWJ#:[id number]#:[id number]  DATE: 08/09/2014  DOB :  1985/12/18  SEX:M                  EEG#:  [id number].  Technician:  JS.      REQUESTING PHYSICIAN:  Georges MouseShumaila Sultan, MD.    DATE OF EEG:  August 09, 2014, 12:20 a.m. to August 10, 2014, 12:20 a.m., day 2.    REPORT:  This is a digitally acquired EEG performed with the standard 10-20 system of electrodes that ran for approximately 24 hours with the timing described above.  The background of the EEG showed theta range nonspecific slowing.  The best background appreciated reached up to 7-8 Hz rhythm.  There were rare right temporal spikes recorded during this monitoring.  Recorded multiple seizures with clinical correlation.  In the first 12 hours of the recording, approximate estimation of 28 seizures and in the next 12 hours of the recording about 38 seizures were recorded.  All seizures were stereotypical in clinical and EEG onset as described in the previous report.  During the seizures, the patient was noted to stare at times with a pause in action but had nonforced head turning, at times rising his right arm or reaching for things.  He was also noted to moan during this event.  In some of them he was able to answer during the seizure.  The EEG onset had poor localization.  At the onset, there was low-amplitude, faster frequency rhythms that appeared to be diffuse but were a little bit more prominent over the frontocentral head region that evolved and noted to be diffuse in evolution. In addition, at baseline this record also showed subtle higher amplitude over the left hemisphere and during some of the seizures the evolution also had higher amplitude over the left when compared to the right in some of them.  Digital analysis  of spike detection revealed these seizures.  The seizure frequency was communicated intermittently with the neurology team for medication adjustments.    INTERPRETATION:  This is an abnormal EEG due to recording multiple seizures with a clear clinical correlate  with poor localization and lateralization.  In addition, also recorded very rare right temporal spikes and nonspecific slowing.       Malva LimesVijayalakshmi Tytianna Greenley, MD  Assistant Professor  [facility] Department of Neurology    QM/VH/[id number]; D: 08/10/2014 22:45:13; T: 08/11/2014 00:46:40    cc: Georges MouseShumaila Sultan MD      Shirleen SchirmerINBASKET

## 2014-08-11 NOTE — CDI WORKSHEET (Signed)
DRG NLV   Working DRG 1: 101 Seizures w/o MCC 1/1  Principle VW:[id number]:Symptomatic Epilepsy / Focal Dyscognative Seizures      Principle Px:   Secondary Dx:, Cerebral palsy,  GAD with panic attacks   MDD, moderate    Cannabis Use Disorder, mild (weekly)  Tobacco Use Disorder, mild   (all POA Yes)  Secondary Px:        Review: 4/16 Neuro H&P 29 y.o., White male with past medical history of cerebral palsy, bipolar depression, and anxiety who [facility]. Patient has been having multiple spells per day that last only a few seconds. He is not postical afterwards. No incontinence. No LOC. He is able to commnicate during spell. He has been evaluated in 8 hospitals recently. His Neurologist Dr. Marney Doctorana told him it was psychiatric. Patient was recently admitted to the service on 4/13 but left AMA shortly after admission  Spells  - Witnessed numerous spells at bedside. Appear non epileptic.  - Will place on vEEG monitoring with the goal of spell capture  - Continue home AEDs including Keppra 1000 mg BID and Depakote 500 mg qhs. Will check Depakote level.  4/19 Behavioral Med consult (pended note)  GAD with panic attacks   MDD, moderate    R/o Bipolar disorder (per pt has been diagnosed in past, however no hx of hypo/mania symptoms)   Cannabis Use Disorder, mild (weekly)   Tobacco Use Disorder, mild    Comments:

## 2014-08-11 NOTE — Progress Notes (Addendum)
[facility]  Neurology Progress Note      [name], 29 y.o. male  Date of Admission:  08/07/2014  Date of service: 08/11/2014  Date of Birth:  26-Dec-1985      Chief Complaint: Spells  Pt's condition today: stable      Subjective: Patient was seen and examined. Reports feeling ok this morning. Wife at bedside. On VEEG. Continues to ask when he can go home. Wife states patient was having greater than       Vital Signs:  Temp (24hrs) Max:36.8 C (98.2 F)      Systolic (24hrs), Avg:108 mmHg, Min:89 mmHg, Max:122 mmHg    Diastolic (24hrs), Avg:69 mmHg, Min:56 mmHg, Max:78 mmHg    Temp  Avg: 36.6 C (97.8 F)  Min: 36.3 C (97.3 F)  Max: 36.8 C (98.2 F)  Pulse  Avg: 89.8  Min: 76  Max: 103  Resp  Avg: 19.6  Min: 18  Max: 20  SpO2  Avg: 95 %  Min: 93 %  Max: 97 %  Pain Score (Numeric, Faces): 9    Today's Physical Exam:    General: appears stated age, no distress  Mental status: Oriented x 3  Memory: Registration, Recall, and Following of commands is normal  Attention: Attention and Concentration are decreased  Knowledge: ok  Language and Speech: Normal and Normal  Cranial nerves: Cranial nerves 2-12 are normal except dysconjugate gaze  Muscle tone: WNL  Motor strength:  Motor strength is normal throughout.  Sensory: Sensory exam in the upper and lower extremities is normal  Coordination: Coordination is normal without tremor  Reflexes: Reflexes are 2/2 throughout      Current Medications:    Current Facility-Administered Medications:  acetaminophen (TYLENOL) tablet 650 mg Oral Q4H PRN   ALPRAZolam (XANAX) tablet 1 mg Oral 2x/day PRN   divalproex (DEPAKOTE) 12 hr delayed release tablet 1,000 mg Oral 2x/day   enoxaparin PF (LOVENOX) 40 mg/0.4 mL SubQ injection 40 mg Subcutaneous Q24H   ketorolac (TORADOL) 30 mg/mL injection 30 mg Intravenous Q6H PRN   levETIRAcetam (KEPPRA) tablet 1,500 mg Oral 2x/day   multivitamin tablet 1 Tab Oral Daily   nicotine (NICODERM CQ) transdermal patch (mg/24 hr) 21 mg  Transdermal Daily   phenytoin sodium extended release (DILANTIN) capsule 100 mg Oral 3x/day   propranolol (INDERAL) tablet 10 mg Oral Q8HRS       I/O:  I/O last 24 hours:      Intake/Output Summary (Last 24 hours) at 08/11/14 0713  Last data filed at 08/10/14 0900   Gross per 24 hour   Intake      0 ml   Output    300 ml   Net   -300 ml     I/O current shift:       Labs  Please indicate ordered or reviewed)  Reviewed: I have reviewed all lab results.      Independent Interpretation of images or specimens:  None new to review    Assessment/Plan:  Active Hospital Problems    Diagnosis    Spells    Cerebral palsy    Bipolar depression    Anxiety    Seizure         Patient is a 29 year old male with PMH of epilepsy on Keppra and Depakote, cerebral palsy, bipolar depression, and anxiety who presents as a transfer from an outside facility for the evaluation of seizures.    Refractory Epilepsy    - VEEG: Patient  continues to have >50  seizures despite 3 AED's  - Dilantin Level is Low. 7.4. Reload today with ~10 m/kg PO in divided doses (300 mg x2). Recheck Level this afternoon.   - Continue Dilantin 100 mg TID.   - Continue  Keppra 1500 mg BID and Depakote to 1000 mg BID  - Will add Vimpat 200 mg IV now and Vimpat 100 mg BID  - Continue VEEG.     Anxiety / Depression    -- Psychiatry consulted.       DVT/PE Prophylaxis: Lovenox       Romona CurlsBenjamin Bradley Pollock, DO 08/11/2014, 07:13       I saw and examined the patient.  I reviewed the resident's note.  I agree with the findings and plan of care as documented in the resident's note.  Any exceptions/additions are edited/noted.    Georges MouseShumaila Narcissus Detwiler, MD 08/11/2014, 12:41

## 2014-08-12 ENCOUNTER — Ambulatory Visit (INDEPENDENT_AMBULATORY_CARE_PROVIDER_SITE_OTHER): Payer: Self-pay | Admitting: Neurology

## 2014-08-12 DIAGNOSIS — G40909 Epilepsy, unspecified, not intractable, without status epilepticus: Secondary | ICD-10-CM

## 2014-08-12 DIAGNOSIS — G40919 Epilepsy, unspecified, intractable, without status epilepticus: Secondary | ICD-10-CM

## 2014-08-12 LAB — BASIC METABOLIC PANEL
ANION GAP: 10 mmol/L (ref 4–13)
BUN/CREAT RATIO: 23 — ABNORMAL HIGH (ref 6–22)
BUN: 19 mg/dL (ref 8–25)
CALCIUM: 9.3 mg/dL (ref 8.5–10.4)
CARBON DIOXIDE: 26 mmol/L (ref 22–32)
CHLORIDE: 101 mmol/L (ref 96–111)
CREATININE: 0.82 mg/dL (ref 0.62–1.27)
ESTIMATED GLOMERULAR FILTRATION RATE: >59 ml/min/1.73m2 (ref >59)
GLUCOSE,NONFAST: 83 mg/dL (ref 65–139)
POTASSIUM: 4 mmol/L (ref 3.5–5.1)
SODIUM: 137 mmol/L (ref 136–145)

## 2014-08-12 LAB — VALPROIC ACID LEVEL
TIME OF LAST DOSE: 2112 h
VALPROIC ACID: 64 ug/mL (ref 50–100)

## 2014-08-12 LAB — CBC/DIFF
BASOPHILS: 1 %
BASOS ABS: 0.1 THOU/uL (ref 0.000–0.200)
EOS ABS: 0.308 THOU/uL (ref 0.000–0.500)
EOSINOPHIL: 3 %
HCT: 44 % (ref 36.7–47.0)
HGB: 14.2 g/dL (ref 12.5–16.3)
LYMPHOCYTES: 35 %
LYMPHS ABS: 3.958 10*3/uL (ref 1.000–4.800)
MCH: 28.4 pg (ref 27.4–33.0)
MCHC: 32.4 g/dL — ABNORMAL LOW (ref 32.5–35.8)
MCV: 87.7 fL (ref 78–100)
MONOCYTES: 8 %
MONOS ABS: 0.94 THOU/uL (ref 0.300–1.000)
MPV: 8.4 fL (ref 7.5–11.5)
PLATELET COUNT: 279 THOU/uL (ref 140–450)
PMN ABS: 5.987 10*3/uL (ref 1.500–7.700)
PMN'S: 53 %
RBC: 5.02 MIL/uL (ref 4.06–5.63)
RDW: 14 % (ref 12.0–15.0)
WBC: 11.3 10*3/uL — ABNORMAL HIGH (ref 3.5–11.0)

## 2014-08-12 LAB — PHENYTOIN
PHENYTOIN: 10.3 ug/mL (ref 10–20)
TIME DRAWN:: 530 h

## 2014-08-12 LAB — ALT (SGPT): ALT (SGPT): 23 U/L (ref <55)

## 2014-08-12 LAB — AST (SGOT): AST (SGOT): 18 U/L (ref 8–48)

## 2014-08-12 MED ORDER — DIVALPROEX 500 MG TABLET,DELAYED RELEASE
1000.00 mg | DELAYED_RELEASE_TABLET | Freq: Two times a day (BID) | ORAL | Status: AC
Start: 2014-08-12 — End: ?

## 2014-08-12 MED ORDER — LACOSAMIDE 100 MG TABLET
100.00 mg | ORAL_TABLET | Freq: Two times a day (BID) | ORAL | Status: AC
Start: 2014-08-12 — End: ?

## 2014-08-12 MED ORDER — LEVETIRACETAM 750 MG TABLET
1500.00 mg | ORAL_TABLET | Freq: Two times a day (BID) | ORAL | Status: AC
Start: 2014-08-12 — End: ?

## 2014-08-12 MED ORDER — PHENYTOIN SODIUM EXTENDED 100 MG CAPSULE
100.00 mg | ORAL_CAPSULE | Freq: Three times a day (TID) | ORAL | Status: AC
Start: 2014-08-12 — End: ?

## 2014-08-12 NOTE — Procedures (Signed)
[facility]                                ELECTROENCEPHALOGRAM REPORT                                EEG\EMG Scheduling [phone]                                 STATUS: I      NAMNoah [name]:  Hollingworth, [name]   NUUV#:[id number]#:[id number]  DATE: 08/11/2014  DOB :  [birth year]  SEX:M                  ADDENDED REPORT    REQUESTING PHYSICIAN:  Georges MouseShumaila Sultan, MD     DATE OF SERVICE:  August 11, 2014, 12:16 a.m. to August 12, 2014, 12:16 a.m., day 4.    REPORT:  This is a digitally acquired EEG performed with the standard 10-20 system of electrodes that ran for approximately 24 hours with the timings described above.  The background of the EEG showed symmetric poorly sustained 8 Hz rhythm.  The predominant of the recording consisted of theta slowing.  Sleep was recorded with vertex spindles and K complexes.  There was subtle asymmetry between the left and right hemisphere.  The left hemisphere had higher amplitude.  At the start of the recording, there were subtle changes similar to his previous seizures that were seen that had some evolutions with higher amplitude on the left and lower amplitude on the right that were concerning for seizures.  That event occurred on August 11, 2014, at around 00:45.  The patient was noted to be talking to his family member throughout the event.  On 1 occasion he did turn the right but at the same time, he also grabbed his foot so it is not clear if it was voluntary .  No further episodes were recorded.  I did not see any clear epileptiform discharges.    INTERPRETATION:  This is an abnormal EEG due to the presence of nonspecific diffuse background slowing, suggesting an encephalopathy pattern.  At the initiation of this fourth day record, there was questionable changes seen on the EEG concerning for his typical seizures.  No further episodes were recorded.  No epileptiform activity was recorded.    ADDENDUM:  Correct date of service is August 11, 2014.      Malva LimesVijayalakshmi Lakyia Behe, MD  Assistant Professor  [facility] Department of Neurology    KV/QQ/[id number]; D: 08/18/2014 08:35:57; T: 08/18/2014 56:43:3208:48:22

## 2014-08-12 NOTE — Progress Notes (Addendum)
[facility]  Neurology Progress Note      [name],[name], 29 y.o. male  Date of Admission:  08/07/2014  Date of service: 08/12/2014  Date of Birth:  Jun 23, 1985      Chief Complaint: Spells  Pt's condition today: stable      Subjective: Patient was seen and examined. Seizures significantly reduced with adjustment of AEDs. Patient is sleepy this morning. Spouse at bedside. No events overnight.       Vital Signs:  Temp (24hrs) Max:36.8 C (98.2 F)      Systolic (24hrs), Avg:102 mmHg, Min:96 mmHg, Max:108 mmHg    Diastolic (24hrs), Avg:63 mmHg, Min:58 mmHg, Max:67 mmHg    Temp  Avg: 36.7 C (98 F)  Min: 36.6 C (97.9 F)  Max: 36.8 C (98.2 F)  Pulse  Avg: 85.8  Min: 71  Max: 108  Resp  Avg: 17.6  Min: 16  Max: 18  SpO2  Avg: 95.3 %  Min: 94 %  Max: 96 %  Pain Score (Numeric, Faces): 8    Today's Physical Exam:    General: appears stated age, no distress  Mental status: Oriented x 3  Memory: Registration, Recall, and Following of commands is normal  Attention: Attention and Concentration are decreased  Knowledge: ok  Language and Speech: Normal and Normal  Cranial nerves: Cranial nerves 2-12 are normal except dysconjugate gaze  Muscle tone: WNL  Motor strength:  Motor strength is normal throughout.  Sensory: Sensory exam in the upper and lower extremities is normal  Coordination: Coordination is normal without tremor  Reflexes: Reflexes are 2/2 throughout      Current Medications:    Current Facility-Administered Medications:  acetaminophen (TYLENOL) tablet 650 mg Oral Q4H PRN   ALPRAZolam (XANAX) tablet 1 mg Oral 2x/day PRN   divalproex (DEPAKOTE) 12 hr delayed release tablet 1,000 mg Oral 2x/day   enoxaparin PF (LOVENOX) 40 mg/0.4 mL SubQ injection 40 mg Subcutaneous Q24H   lacosamide (VIMPAT) tablet 100 mg Oral 2x/day   levETIRAcetam (KEPPRA) tablet 1,500 mg Oral 2x/day   multivitamin tablet 1 Tab Oral Daily   nicotine (NICODERM CQ) transdermal patch (mg/24 hr) 21 mg Transdermal Daily   phenytoin sodium  extended release (DILANTIN) capsule 100 mg Oral 3x/day       I/O:  I/O last 24 hours:      Intake/Output Summary (Last 24 hours) at 08/12/14 0644  Last data filed at 08/11/14 1500   Gross per 24 hour   Intake   1920 ml   Output    300 ml   Net   1620 ml     I/O current shift:       Labs  Please indicate ordered or reviewed)  Reviewed: I have reviewed all lab results.      Independent Interpretation of images or specimens:  None new to review    Assessment/Plan:  Active Hospital Problems    Diagnosis    Spells    Cerebral palsy    Bipolar depression    Anxiety    Seizure         Patient is a 29 year old male with PMH of epilepsy on Keppra and Depakote, cerebral palsy, bipolar depression, and anxiety who presents as a transfer from an outside facility for the evaluation of seizures.    Refractory Epilepsy - Improved    - VEEG: No seizures in last 12-hours on cEEG.   - Continue Dilantin 100 mg TID, Keppra 1500 mg BID, Depakote 1000  mg BID, and Vimpat 100 mg BID  - Dilantin level 10.3. Depakote level 64.   - AST and ALT WNL.  - [place] [facility] [place] [facility] [place]      DVT/PE Prophylaxis: Lovenox       Romona CurlsBenjamin Bradley Pollock, DO 08/12/2014, 06:44       I saw and examined the patient.  I reviewed the resident's note.  I agree with the findings and plan of care as documented in the resident's note.  Any exceptions/additions are edited/noted.    Georges MouseShumaila Demoni Parmar, MD 08/12/2014, 10:43

## 2014-08-12 NOTE — Procedures (Signed)
[facility] [place]                                ELECTROENCEPHALOGRAM REPORT                                EEG\EMG Scheduling [phone]                                 STATUS: I      NAMNoah Delaine:  Streetman, Kedrick   UJWJ#:[id number]#:[id number]  DATE: 08/12/2014  DOB :  April 08, 1986  SEX:M                  EEG #:  [id number].  Technician:  JS.    REQUESTING PHYSICIAN:  Georges MouseShumaila Sultan, MD.    DATE AND TIME:  August 12, 2014, at 6:12 a.m. to August 12, 2014, at 9:20 a.m.    REPORT:  This is a digitally acquired EEG performed with the standard 10-20 system of electrodes that ran for approximately 9 hours' duration on the last day.  The background of the EEG shows diffuse nonspecific slowing, consisting of theta range activity.  The background reaches up to 8 Hz but is poorly sustained.  Sleep is recorded with vertex spindles and K complexes.  No epileptiform activity or clinical pushbutton event is recorded.    INTERPRETATION:  This is a mildly abnormal EEG due to the presence of nonspecific diffuse slowing, suggesting an encephalopathy pattern.  No seizures were recorded.      Malva LimesVijayalakshmi Elaine Roanhorse, MD  Assistant Professor  [facility] Neurology    MV/[id number]; D: 08/12/2014 11:29:07; T: 08/12/2014 11:58:08    cc: Georges MouseShumaila Sultan MD      Shirleen SchirmerINBASKET

## 2014-08-12 NOTE — Discharge Summary (Addendum)
DISCHARGE SUMMARY      PATIENT NAMEGay, [name]  MRN:  [id number]  DOB:  1985-11-27    ADMISSION DATE:  08/07/2014  DISCHARGE DATE:  08/12/2014    ATTENDING PHYSICIAN: Georges Mouse, MD  PRIMARY CARE PHYSICIAN: No Established Pcp     DISCHARGE DIAGNOSIS:   Principle Problem: Refractory epilepsy  Active Hospital Problems    Diagnosis Date Noted    Principle Problem: Refractory epilepsy 08/12/2014    Spells 08/07/2014    Cerebral palsy 08/07/2014    Bipolar depression 08/07/2014    Anxiety 08/07/2014    Seizure 08/04/2014      Resolved Hospital Problems    Diagnosis    No resolved problems to display.     There are no active non-hospital problems to display for this patient.     No Known Allergies  DISCHARGE MEDICATIONS:     Current Discharge Medication List      START taking these medications.       Details    lacosamide 100 mg Tablet   Commonly known as:  VIMPAT    100 mg, Oral, 2 TIMES DAILY   Qty:  60 Tab   Refills:  3       phenytoin sodium extended release 100 mg Capsule   Commonly known as:  DILANTIN    100 mg, Oral, 3 TIMES DAILY   Qty:  90 Cap   Refills:  3         CONTINUE these medications which have CHANGED during your visit.       Details    divalproex 500 mg Tablet, Delayed Release (E.C.)   Commonly known as:  DEPAKOTE   What changed:    - how much to take  - when to take this    1,000 mg, Oral, 2 TIMES DAILY   Qty:  120 Tab   Refills:  3       LevETIRAcetam 750 mg Tablet   Commonly known as:  KEPPRA   What changed:    - how much to take  - when to take this    1,500 mg, Oral, 2 TIMES DAILY   Qty:  120 Tab   Refills:  3         CONTINUE these medications - NO CHANGES were made during your visit.       Details    ALPRAZolam 1 mg Tablet   Commonly known as:  XANAX    1 mg, Oral, 2 TIMES DAILY PRN   Refills:  0       multivitamin Tablet    1 Tab, Oral, DAILY   Refills:  0         STOP taking these medications.          risperiDONE 1 mg Tablet   Commonly known as:  RISPERDAL           DISCHARGE  INSTRUCTIONS:      MISCELLANEOUS INSTRUCTIONS TO PATIENT   You have an appointment with [facility]rology with epilepsy specialist Dr. Maryruth Bun on 09/24/14 at 8 AM. Please call [phone] if questions. They have hospitality housing for $10 per night so that you could stay there the night before the appointment.     MISCELLANEOUS INSTRUCTIONS TO PATIENT   Patient was informed that per [facility] state law that she is prohibited to drive x 6 months after a seizure          REASON FOR HOSPITALIZATION AND HOSPITAL COURSE:  Patient is  a 29 year old male with PMH of childhood epilepsy on Keppra 500 mg BID and Depakote 500 QHS, cerebral palsy, depression, and anxiety who presents as a transfer from [facility] for the evaluation of seizures. Patient was admitted to the EMU. Patient had 63 clinical seizures in the first 24-hours on VEEG. Keppra dose was increased to 1500 mg BID and patient was loaded with Depakote. Maintenance dose was increased to 1000 mg BID (trough level 64). Patient continued to have seizure despite increasing Keppra and Depakote. He was than loaded with Dilantin and started on Dilantin 100 mg BID. Seizure frequency improved. Vimpat was also added at 100 mg BID. Patient continued on VEEG with seizure freedom for >12 hours. Patient then requested to leave. He was tolerating medications Well. Labs WNL. VSS. Dilantin trough of 10.3 and Depakote trough of 64. AST and ALT WNL. 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 Patient discharged to home with spouse and will follow up with [facility] on 09/24/2014 at 8 AM with Dr. Maryruth Bun.      Refractory Epilepsy - Improved    - VEEG: No seizures in last 12-hours on cEEG.   - Continue Dilantin 100 mg TID, Keppra 1500 mg BID, Depakote 1000 mg BID, and Vimpat 100 mg BID  - Dilantin level 10.3. Depakote level 64.   - AST and ALT WNL.  - [place]. Will  need referral to academic center in [place].    VEEG August 08, 2014:    REPORT: This is a digitally acquired EEG performed with the standard 10-20 system of electrodes that ran for approximately 24 hours with the timing described above. The background of the EEG showed 7-8 Hz, a symmetric, posterior dominant rhythm. Recorded less frequent right temporal epileptiform discharges. A single occasion poorly characterized left temporal sharp waves were seen which is of unclear significance. Sleep was recorded with vertex spindles and K complexes. During this monitoring, recorded multiple seizures with clinical correlation. The first 12 hours of the recorded approximately 30 seizures and the last 12 hours of the recording recorded approximately 33 seizures. All these seizures were very stereotypical in clinical an EEG onset. During the onset of the seizures, the patient's family reported that the patient stared, at times had a pause in his action, but in some of them he was able to communicate. In all the seizures, at the onset the patient was noted to have nonforced head turn to the right followed by right arm half extended or being lifted up. He appears to be agitated during these events. On the EEG, the onset was poorly localized, but noted to have low amplitude faster frequency rhythms seen over the right hemisphere that evolves in frequency diffusely with higher amplitude and more notable evolution on the right frontal, followed by diffuse delta activity. These seizures lasted approximately 1-2 seconds in duration. Digital analysis of spike detection revealed above-mentioned seizures and spikes. The seizure frequencies were communicated intermittently to the team taking care of the patient for adjusting the antiepileptic medication.    INTERPRETATION: This is a very abnormal EEG due to recorded multiple electrographic seizures as described in the report with poor localization and lateralization, but the  subtle more rhythmic changes on the right hemisphere and evolution could suggest right hemispheric origin.    VEEG August 09, 2014:    REPORT: This is a digitally acquired  EEG performed with the standard 10-20 system of electrodes that ran for approximately 24 hours with the timing described above. The background of the EEG showed theta range nonspecific slowing. The best background appreciated reached up to 7-8 Hz rhythm. There were rare right temporal spikes recorded during this monitoring. Recorded multiple seizures with clinical correlation. In the first 12 hours of the recording, approximate estimation of 28 seizures and in the next 12 hours of the recording about 38 seizures were recorded. All seizures were stereotypical in clinical and EEG onset as described in the previous report. During the seizures, the patient was noted to stare at times with a pause in action but had nonforced head turning, at times rising his right arm or reaching for things. He was also noted to moan during this event. In some of them he was able to answer during the seizure. The EEG onset had poor localization. At the onset, there was low-amplitude, faster frequency rhythms that appeared to be diffuse but were a little bit more prominent over the frontocentral head region that evolved and noted to be diffuse in evolution. At baseline, the patient's EEG also showed subtle higher amplitude over the left hemisphere and during some of the seizures the evolution also had higher amplitude over the left when compared to the right in some of them. Digital analysis of spike detection revealed these seizures. The seizure frequency was communicated intermittently with the neurology team for medication adjustments.    INTERPRETATION: This is an abnormal EEG due to recording multiple seizures with a clear clinical correlate (approximately 28 in the first 12 hours, 38 in the next 12 hours) with poor localization and lateralization.  In addition also recorded very rare right temporal spikes and nonspecific slowing.      VEEG August 10, 2014:    REPORT: This is a digitally acquired EEG performed with the standard 10-20 system of electrodes that ran for approximately 20 hours' and 4 minutes' duration. The study was briefly interrupted as the patient pulled all of his wires off. The background of the EEG continued to show intermittent theta slowing. The best background appreciated reached up to 8 Hz. Sleep was recorded with vertex spindles and K complexes. The first 8 hours of the recording captured approximately 13 seizures. All of these seizures were stereotypical as described in the previous report. The patient was noted to have staring and pausing of action. He answered appropriately in a few of the seizures. He was noted to be agitated during these events. On the EEG, the onset had poor localization. There was a clear change in background rhythm that was characterized by diffuse low-amplitude slowing with subtle faster frequency rhythms noted on the midline region. These evolved in frequency with theta and delta slowing. The delta amplitude was higher on the left hemisphere when compared to the right hemisphere. The last 12 hours of the recording captured approximately about 4 seizures. The last one was approximately August 10, 2014, around [place]. The seizure semiology and EEG were similar, as described above. In addition, the patient was also noted to have rare right temporal spikes occasionally with wider distribution. In addition, at baseline, this record also showed asymmetry with higher amplitude over the left hemisphere.    INTERPRETATION: This is an abnormal EEG due to recorded seizures with poor localization and lateralization, but overall when compared to the previous day's recording, the seizure frequency showed significant improvement, especially in the last 12 hours. In addition, also recorded epileptiform  discharges  from the right temporal and diffuse slowing being worse with lower amplitude on the right hemisphere.        CONDITION ON DISCHARGE:  A. Ambulation: Full ambulation  B. Self-care Ability: Complete  C. Cognitive Status Alert and Oriented x 3  D. DNR status at discharge: Full Code    DISCHARGE DISPOSITION:  Home discharge          Romona Curls, DO        Copies sent to Care Team       Relationship Specialty Notifications Start End    Pcp, No Established PCP - General   08/04/14     [facility], [facility] Physician EXTERNAL  08/12/14     Phone: [phone] Fax: [phone]         [address] [place] [address]    Trellis Paganini, MD Physician EXTERNAL  08/12/14     Phone: [phone] Fax: [phone]         [address] [place] [address]          Referring providers can utilize [email or web address] to access their referred [employer] patient's information.

## 2014-08-12 NOTE — Telephone Encounter (Signed)
Please advise 

## 2014-08-12 NOTE — Telephone Encounter (Signed)
-----   Message from Sherran NeedsShaun Lewis sent at 08/12/2014  4:22 PM EDT -----  >> Sidonie DickensSHAUN LEWIS 08/12/2014 04:22 PM  Pollock                    Pts pharmacy is calling and asking for a call to discuss all of the medication prescribed today: vimpat, depakote, keppra, risperdal. Please call to advise Preferred Pharmacy     [facility] [address] [place] - [address]    [address] [place] [id number]    Phone: [phone] Fax: [phone]    Open 24 Hours?: No

## 2014-08-12 NOTE — Nurses Notes (Signed)
P IV removed. Dr Julius BowelsPollock reviewed discharge summary with [name]. Medication prescriptions given to [name]. All questions answered. [name] left room 968 walking independently with all possessions accompanied by wife.

## 2014-08-13 NOTE — Telephone Encounter (Signed)
Spoke with pharmacist. Patient needs prior authorization for Vimpat.     [id number] [phone] or [phone]   Medicaid Number [id number]    Will call for prior authorization.     Romona CurlsBenjamin Bradley Gudrun Axe, DO  08/13/2014, 15:13

## 2014-08-16 NOTE — Telephone Encounter (Signed)
Spoke with insurance company. Will fax prior auth form today to complete.    Romona CurlsBenjamin Bradley Telina Kleckley, DO  08/16/2014, 09:34

## 2014-08-18 ENCOUNTER — Ambulatory Visit (INDEPENDENT_AMBULATORY_CARE_PROVIDER_SITE_OTHER): Payer: Self-pay | Admitting: Neurology

## 2014-08-18 NOTE — Telephone Encounter (Signed)
-----   Message from Jones BalesStephanie Gamble sent at 08/18/2014 11:16 AM EDT -----  >> STEPHANIE GAMBLE 08/18/2014 11:16 AM  Dr Vance PeperSultan pt                           Pt was seen in the hospital and states that the vimpat needs a prior auth and this has not been done yet and he has been waiting since 4.21.16 he would like a status update please call

## 2014-08-18 NOTE — Telephone Encounter (Signed)
Thank you. I will sign the form tomorrow in clinic.     Romona CurlsBenjamin Bradley Porfiria Heinrich, DO  08/18/2014, 15:21

## 2014-08-18 NOTE — Telephone Encounter (Signed)
Prior Berkley Harveyauth form is in Dr. Juliene PinaPollock's box for [employer]

## 2014-08-20 NOTE — Telephone Encounter (Signed)
vimpat approved 08/19/14-08/19/15

## 2018-02-21 ENCOUNTER — Emergency Department
Admission: EM | Admit: 2018-02-21 | Discharge: 2018-02-22 | Disposition: A | Discharge status: Home / Self Care | Payer: Medicaid - Out of State | Attending: Emergency Medicine | Admitting: Emergency Medicine

## 2018-02-21 DIAGNOSIS — R55 Syncope and collapse: Secondary | ICD-10-CM | POA: Insufficient documentation

## 2018-02-21 DIAGNOSIS — R911 Solitary pulmonary nodule: Secondary | ICD-10-CM

## 2018-02-21 DIAGNOSIS — R0789 Other chest pain: Secondary | ICD-10-CM | POA: Insufficient documentation

## 2018-02-21 DIAGNOSIS — E876 Hypokalemia: Secondary | ICD-10-CM | POA: Insufficient documentation

## 2018-02-21 DIAGNOSIS — R079 Chest pain, unspecified: Secondary | ICD-10-CM

## 2018-02-21 NOTE — ED Provider Notes (Signed)
[facility] Emergency Department Provider Note   ____________________________________________   First MD Initiated Contact with Patient 02/21/18 2354     (approximate)  I have reviewed the triage vital signs and the nursing notes.   HISTORY  Chief Complaint Chest pain  Level V caveat: Limited by altered mentation  HPI Gabriel Tucker is a 32 y.o. male brought to the ED from a friend's house via EMS with a chief complaint of chest pain and syncope.  Reportedly patient just came in from [place], passed out and complained of right-sided chest pain when he came to.  States he has not taken his medications (Abilify, Keppra, Depakote, Neurontin) in 2 days.  States he had a seizure 2 days ago.  Unsure of his medication dosages.  Denies recent fever, chills, shortness of breath, abdominal pain, nausea, vomiting, diaphoresis.  Denies recent trauma.  Denies use of anticoagulants.   Past medical history Refractory epilepsy (CMS HCC) 08/12/2014  Spells 08/07/2014  Cerebral palsy (CMS HCC) 08/07/2014  Bipolar depression (CMS HCC) 08/07/2014  Anxiety 08/07/2014  Seizure (CMS HCC)     There are no active problems to display for this patient.    Prior to Admission medications   Not on File    Allergies NKDA  No family history on file.  Social History Social History   Tobacco Use  . Smoking status: Not on file  Substance Use Topics  . Alcohol use: Not on file  . Drug use: Not on file  + Smoker  Review of Systems  Constitutional: No fever/chills Eyes: No visual changes. ENT: No sore throat. Cardiovascular: Positive for chest pain. Respiratory: Denies shortness of breath. Gastrointestinal: No abdominal pain.  No nausea, no vomiting.  No diarrhea.  No constipation. Genitourinary: Negative for dysuria. Musculoskeletal: Negative for back pain. Skin: Negative for rash. Neurological: Positive for reported syncope.  Negative for headaches, focal  weakness or numbness.   ____________________________________________   PHYSICAL EXAM:  VITAL SIGNS: ED Triage Vitals  Enc Vitals Group     BP      Pulse      Resp      Temp      Temp src      SpO2      Weight      Height      Head Circumference      Peak Flow      Pain Score      Pain Loc      Pain Edu?      Excl. in GC?     Constitutional: Alert and oriented. Well appearing and in mild acute distress. Eyes: Conjunctivae are normal. PERRL. EOMI. Head: Atraumatic. Nose: Atraumatic. Mouth/Throat: Mucous membranes are moist.  No dental malocclusion. Neck: No stridor.  No cervical spine tenderness to palpation.  Supple neck without meningismus.  No carotid bruits. Cardiovascular: Normal rate, regular rhythm. Grossly normal heart sounds.  Good peripheral circulation. Respiratory: Normal respiratory effort.  No retractions. Lungs CTAB.  Right upper anterior chest wall tender to palpation. Gastrointestinal: Soft and nontender to light or deep palpation. No distention. No abdominal bruits. No CVA tenderness. Musculoskeletal: No lower extremity tenderness nor edema.  No joint effusions. Neurologic: Alert and oriented x3.  CN II-XII grossly intact.  Normal speech and language. No gross focal neurologic deficits are appreciated.  Skin:  Skin is warm, dry and intact. No rash noted. Psychiatric: Mood and affect are normal. Speech and behavior are normal.  ____________________________________________   LABS (all  labs ordered are listed, but only abnormal results are displayed)  Labs Reviewed  CBC WITH DIFFERENTIAL/PLATELET - Abnormal; Notable for the following components:      Result Value   WBC 12.2 (*)    Abs Immature Granulocytes 0.10 (*)    All other components within normal limits  COMPREHENSIVE METABOLIC PANEL - Abnormal; Notable for the following components:   Potassium 3.0 (*)    Glucose, Bld 106 (*)    All other components within normal limits  CK - Abnormal;  Notable for the following components:   Total CK 626 (*)    All other components within normal limits  VALPROIC ACID LEVEL - Abnormal; Notable for the following components:   Valproic Acid Lvl 28 (*)    All other components within normal limits  ETHANOL  TROPONIN I  LIPASE, BLOOD  TROPONIN I  URINE DRUG SCREEN, QUALITATIVE ([facility] ONLY)   ____________________________________________  EKG  ED ECG REPORT I, Sylvester Minton J, the attending physician, personally viewed and interpreted this ECG.   Date: 02/22/2018  EKG Time: 2355  Rate: 112  Rhythm: sinus tachycardia  Axis: Normal  Intervals:none  ST&T Change: Nonspecific  ____________________________________________  RADIOLOGY  ED MD interpretation: No acute cardiopulmonary process on chest x-ray; CT head demonstrates no ICH, CT chest demonstrates no PE; pulmonary nodule noted  Official radiology report(s): Dg Chest 2 View  Result Date: 02/22/2018 CLINICAL DATA:  32 year old male with chest pain. EXAM: CHEST - 2 VIEW COMPARISON:  None. FINDINGS: The heart size and mediastinal contours are within normal limits. Both lungs are clear. The visualized skeletal structures are unremarkable. IMPRESSION: No active cardiopulmonary disease. Electronically Signed   By: Elgie Collard M.D.   On: 02/22/2018 00:40   Ct Head Wo Contrast  Result Date: 02/22/2018 CLINICAL DATA:  Syncope.  RIGHT arm numbness. EXAM: CT HEAD WITHOUT CONTRAST TECHNIQUE: Contiguous axial images were obtained from the base of the skull through the vertex without intravenous contrast. COMPARISON:  None. FINDINGS: Mild motion degraded examination. BRAIN: No intraparenchymal hemorrhage, mass effect nor midline shift. The ventricles and sulci are normal. No acute large vascular territory infarcts. No abnormal extra-axial fluid collections. Basal cisterns are patent. VASCULAR: Unremarkable. SKULL/SOFT TISSUES: No skull fracture. No significant soft tissue swelling. ORBITS/SINUSES:  The included ocular globes and orbital contents are normal.Trace paranasal sinus mucosal thickening. Mastoid air cells are well aerated. OTHER: None. IMPRESSION: Negative mildly motion degraded noncontrast CT HEAD. Electronically Signed   By: Awilda Metro M.D.   On: 02/22/2018 02:00   Ct Angio Chest Pe W/cm &/or Wo Cm  Result Date: 02/22/2018 CLINICAL DATA:  32 year old male with chest pain. EXAM: CT ANGIOGRAPHY CHEST WITH CONTRAST TECHNIQUE: Multidetector CT imaging of the chest was performed using the standard protocol during bolus administration of intravenous contrast. Multiplanar CT image reconstructions and MIPs were obtained to evaluate the vascular anatomy. CONTRAST:  75mL ISOVUE-370 IOPAMIDOL (ISOVUE-370) INJECTION 76% COMPARISON:  Chest radiograph dated 02/22/2018 FINDINGS: Cardiovascular: There is no cardiomegaly or pericardial effusion. The thoracic aorta is unremarkable. 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 Evaluation of the pulmonary arteries is limited due to suboptimal opacification and timing of the contrast as well as due to respiratory motion. No definite large or central pulmonary artery embolus identified. Mildly distended main pulmonary trunk may be suggestive of a degree of pulmonary hypertension. Clinical correlation is recommended. Mediastinum/Nodes: No hilar or mediastinal adenopathy. Esophagus and the thyroid gland are grossly unremarkable. No mediastinal fluid collection. Lungs/Pleura:  Minimal bibasilar hazy atelectasis. There is a 12 mm subpleural nodule in the left lower lobe. No focal consolidation, pleural effusion, or pneumothorax. The central airways are patent. Upper Abdomen: Probable fatty liver. The visualized upper abdomen is otherwise unremarkable. Musculoskeletal: No chest wall abnormality. No acute or significant osseous findings. Review of the MIP images confirms the above findings. IMPRESSION: 1. No acute intrathoracic  pathology. No CT evidence of central pulmonary artery embolus. 2. A 12 mm left lower lobe subpleural nodule. Follow-up as clinically indicated. Electronically Signed   By: Elgie Collard M.D.   On: 02/22/2018 02:09    ____________________________________________   PROCEDURES  Procedure(s) performed: None  Procedures  Critical Care performed: No  ____________________________________________   INITIAL IMPRESSION / ASSESSMENT AND PLAN / ED COURSE  As part of my medical decision making, I reviewed the following data within the electronic MEDICAL RECORD NUMBER Nursing notes reviewed and incorporated, Labs reviewed, EKG interpreted, Old chart reviewed, Radiograph reviewed and Notes from prior ED visits   32 year old male with cerebral palsy, seizure disorder who has been out of his medications for the past 2 days presenting with right-sided chest pain and reported syncope. Differential diagnosis includes, but is not limited to, ACS, aortic dissection, pulmonary embolism, cardiac tamponade, pneumothorax, pneumonia, pericarditis, myocarditis, GI-related causes including esophagitis/gastritis, and musculoskeletal chest wall pain.    Patient is noncontributory to the history which makes me wonder if he had a seizure although there is no tongue biting or urinary incontinence.  In addition to cardiac panel, will obtain CT head to evaluate for intracranial hemorrhage, CTA chest to evaluate for PE.  Will reassess.  Clinical Course as of Feb 23 527  Sat Feb 22, 2018  0015 This is patient's first time in our emergency department; no old records are available.   [JS]  [id number] Patient sleeping soundly in no acute distress.  I am now able to see patient's old records in [place].  Looks like he saw his neurologist in September and taking these medications at that time: Current Outpatient Medications:  . divalproex 500 MG tablet, Take 3 tablets by mouth nightly., Disp: 90 tablet, Rfl: 11 . gabapentin (NEURONTIN)  300 MG capsule, Take 1 capsule by mouth 2 times daily., Disp: 60 capsule, Rfl: 5 . levetiracetam (KEPPRA) 1000 MG tablet, Take 2 tablets by mouth 2 times daily., Disp: 360 tablet, Rfl: 3 Will administer home meds now as he has been out for the past 2 days.   [JS]  [id number] Updated patient on all test results.  Will try to find his friend for a ride back.  Looks like patient has plenty of refills on all of his medications.  Strict return precautions given.  Patient verbalizes understanding and agrees with plan of care.   [JS]    Clinical Course User Index [JS] Irean Hong, MD     ____________________________________________   FINAL CLINICAL IMPRESSION(S) / ED DIAGNOSES  Final diagnoses:  Chest pain, unspecified type  Hypokalemia  Pulmonary nodule     ED Discharge Orders    None       Note:  This document was prepared using Dragon voice recognition software and may include unintentional dictation errors.    Irean Hong, MD 02/22/18 [phone]

## 2018-02-21 NOTE — ED Triage Notes (Signed)
Pt arrived via ems from a friends house c/o chest pain that started earlier this evening. Pt had just came from [place] and passed out and then came too stating that he was having chest pain. Pt has not taken medications(abilify, keppra, Depakote, and Neurontin) in 2 days. Pt also c/o of right arm numbness. 324 of ASA given by ems, 158/90, 110, 98% on RA.

## 2018-02-22 ENCOUNTER — Emergency Department: Payer: Medicaid - Out of State

## 2018-02-22 ENCOUNTER — Other Ambulatory Visit: Payer: Self-pay

## 2018-02-22 LAB — COMPREHENSIVE METABOLIC PANEL
ALK PHOS: 46 U/L (ref 38–126)
ALT: 19 U/L (ref 0–44)
ANION GAP: 10 (ref 5–15)
AST: 32 U/L (ref 15–41)
Albumin: 3.9 g/dL (ref 3.5–5.0)
BILIRUBIN TOTAL: 0.8 mg/dL (ref 0.3–1.2)
BUN: 13 mg/dL (ref 6–20)
CALCIUM: 9 mg/dL (ref 8.9–10.3)
CO2: 26 mmol/L (ref 22–32)
Chloride: 102 mmol/L (ref 98–111)
Creatinine, Ser: 0.97 mg/dL (ref 0.61–1.24)
Glucose, Bld: 106 mg/dL — ABNORMAL HIGH (ref 70–99)
Potassium: 3 mmol/L — ABNORMAL LOW (ref 3.5–5.1)
Sodium: 138 mmol/L (ref 135–145)
Total Protein: 7 g/dL (ref 6.5–8.1)

## 2018-02-22 LAB — CBC WITH DIFFERENTIAL/PLATELET
Abs Immature Granulocytes: 0.1 10*3/uL — ABNORMAL HIGH (ref 0.00–0.07)
Basophils Absolute: 0.1 10*3/uL (ref 0.0–0.1)
Basophils Relative: 1 %
EOS ABS: 0.3 10*3/uL (ref 0.0–0.5)
EOS PCT: 2 %
HEMATOCRIT: 40.3 % (ref 39.0–52.0)
HEMOGLOBIN: 13.5 g/dL (ref 13.0–17.0)
Immature Granulocytes: 1 %
LYMPHS ABS: 3.4 10*3/uL (ref 0.7–4.0)
LYMPHS PCT: 28 %
MCH: 29.4 pg (ref 26.0–34.0)
MCHC: 33.5 g/dL (ref 30.0–36.0)
MCV: 87.8 fL (ref 80.0–100.0)
MONO ABS: 0.9 10*3/uL (ref 0.1–1.0)
Monocytes Relative: 7 %
Neutro Abs: 7.5 10*3/uL (ref 1.7–7.7)
Neutrophils Relative %: 61 %
Platelets: 343 10*3/uL (ref 150–400)
RBC: 4.59 MIL/uL (ref 4.22–5.81)
RDW: 13.4 % (ref 11.5–15.5)
WBC: 12.2 10*3/uL — AB (ref 4.0–10.5)
nRBC: 0 % (ref 0.0–0.2)

## 2018-02-22 LAB — TROPONIN I: Troponin I: <0.03 ng/mL (ref <0.03)

## 2018-02-22 LAB — CK: Total CK: 626 U/L — ABNORMAL HIGH (ref 49–397)

## 2018-02-22 LAB — VALPROIC ACID LEVEL: Valproic Acid Lvl: 28 ug/mL — ABNORMAL LOW (ref 50.0–100.0)

## 2018-02-22 LAB — ETHANOL

## 2018-02-22 LAB — LIPASE, BLOOD: LIPASE: 24 U/L (ref 11–51)

## 2018-02-22 MED ORDER — SODIUM CHLORIDE 0.9 % IV BOLUS
1000.0000 mL | Freq: Once | INTRAVENOUS | Status: AC
  Administered 2018-02-22: 1000 mL via INTRAVENOUS

## 2018-02-22 MED ORDER — ONDANSETRON HCL 4 MG/2ML IJ SOLN
4.0000 mg | Freq: Once | INTRAMUSCULAR | Status: AC
  Administered 2018-02-22: 4 mg via INTRAVENOUS
  Filled 2018-02-22: qty 2

## 2018-02-22 MED ORDER — LEVETIRACETAM 500 MG PO TABS
2000.0000 mg | ORAL_TABLET | Freq: Once | ORAL | Status: AC
  Administered 2018-02-22: 2000 mg via ORAL
  Filled 2018-02-22: qty 4

## 2018-02-22 MED ORDER — DIVALPROEX SODIUM 500 MG PO DR TAB
1500.0000 mg | DELAYED_RELEASE_TABLET | Freq: Once | ORAL | Status: AC
  Administered 2018-02-22: 1500 mg via ORAL
  Filled 2018-02-22: qty 3

## 2018-02-22 MED ORDER — GABAPENTIN 300 MG PO CAPS
300.0000 mg | ORAL_CAPSULE | Freq: Once | ORAL | Status: AC
  Administered 2018-02-22: 300 mg via ORAL
  Filled 2018-02-22: qty 1

## 2018-02-22 MED ORDER — POTASSIUM CHLORIDE CRYS ER 20 MEQ PO TBCR
40.0000 meq | EXTENDED_RELEASE_TABLET | Freq: Once | ORAL | Status: AC
  Administered 2018-02-22: 40 meq via ORAL
  Filled 2018-02-22: qty 2

## 2018-02-22 MED ORDER — IOPAMIDOL (ISOVUE-370) INJECTION 76%
75.0000 mL | Freq: Once | INTRAVENOUS | Status: AC | PRN
  Administered 2018-02-22: 75 mL via INTRAVENOUS

## 2018-02-22 MED ORDER — MORPHINE SULFATE (PF) 4 MG/ML IV SOLN
4.0000 mg | Freq: Once | INTRAVENOUS | Status: AC
  Administered 2018-02-22: 4 mg via INTRAVENOUS
  Filled 2018-02-22: qty 1

## 2018-02-22 NOTE — Discharge Instructions (Addendum)
Please get your medicines refilled and take them as directed by your doctor.  Your CT scan showed an incidental finding of a small left lung nodule.  Your doctor will want to monitor this.  Return to the ER for worsening symptoms, persistent vomiting, difficulty breathing or other concerns.

## 2018-02-22 NOTE — ED Notes (Signed)
Pt informed that he would need 2nd IV in order to complete test. IV attempted with pt continually flailing around in bed. Pt crying to have IV removed. This RN removed IV.

## 2018-10-20 MED ADMIN — miconazole nitrate 2 % topical cream: TOPICAL | @ 21:00:00 | NDC 00472073542

## 2020-05-18 IMAGING — CT CT HEAD W/O CM
3 of 5 series · 15 of 47 positions shown, 18 images · non-contrast
Comparison: None.

CLINICAL DATA: Syncope.  RIGHT arm numbness.

EXAM:
CT HEAD WITHOUT CONTRAST
TECHNIQUE: Contiguous axial images were obtained from the base of the skull
through the vertex without intravenous contrast.

[Series 3: head wo · axial · 0.41mm/px · z∈[-83,+32]mm · 9 of 29 slices shown, 12 images]
[im 3/29  brain]
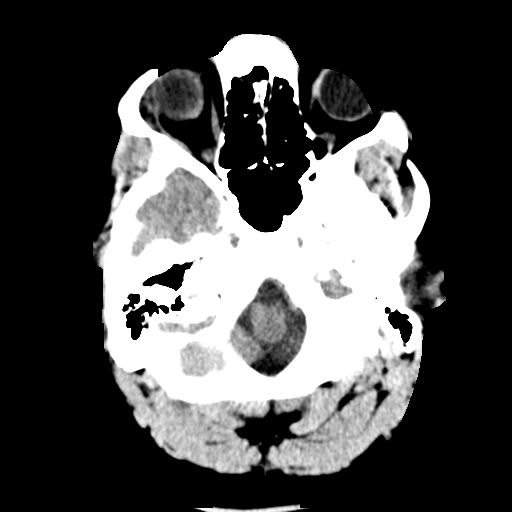
[im 3/29  bone]
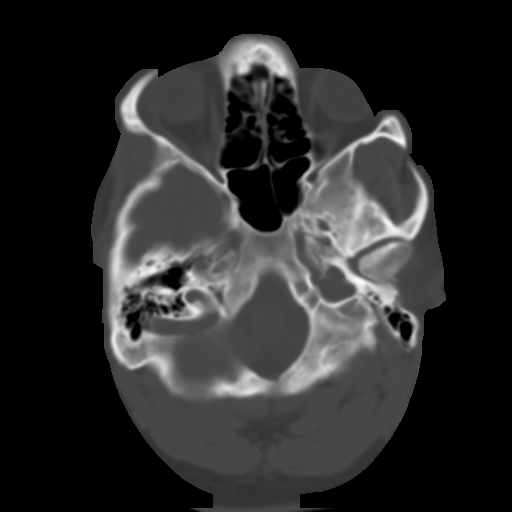
[im 7/29  brain]
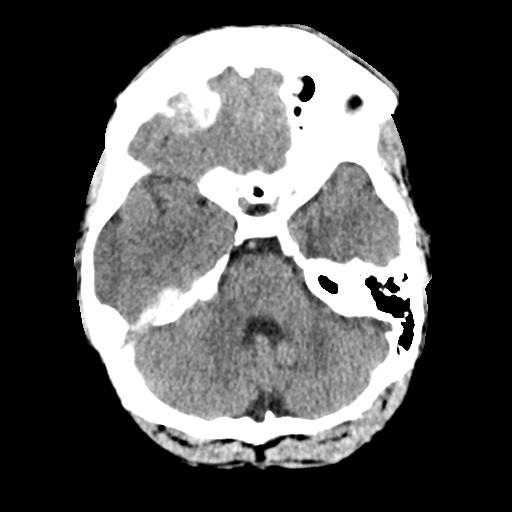
[im 9/29  brain]
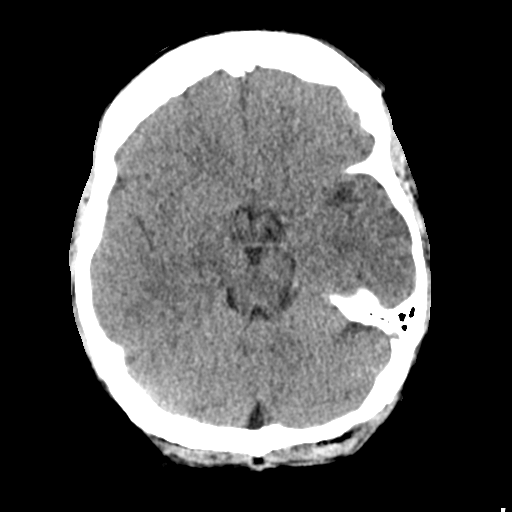
[im 11/29  brain]
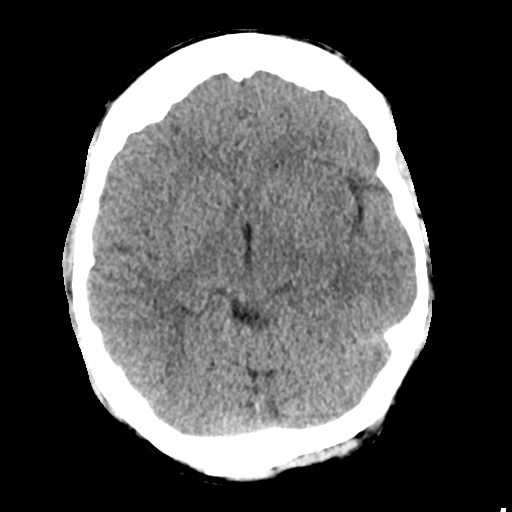
[im 16/29  brain]
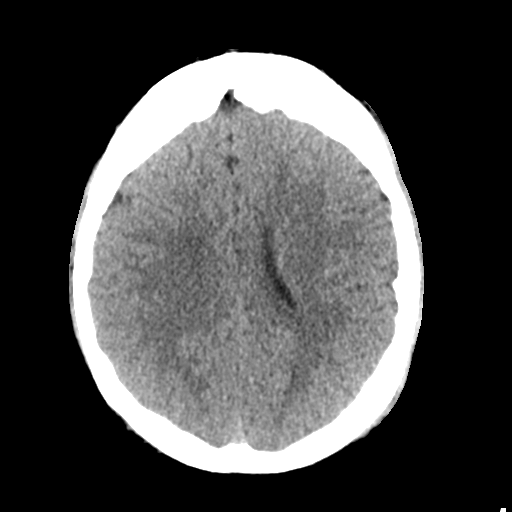
[im 16/29  bone]
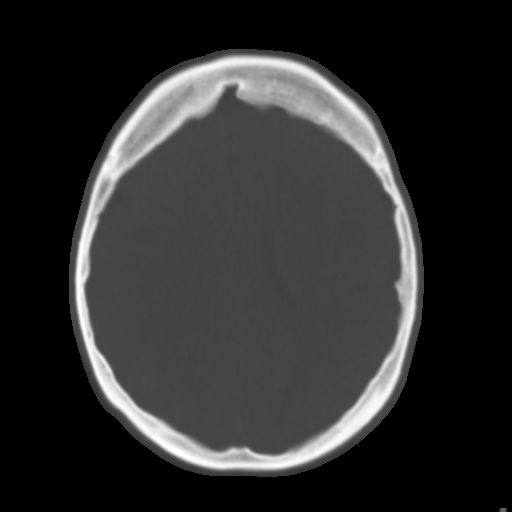
[im 18/29  brain]
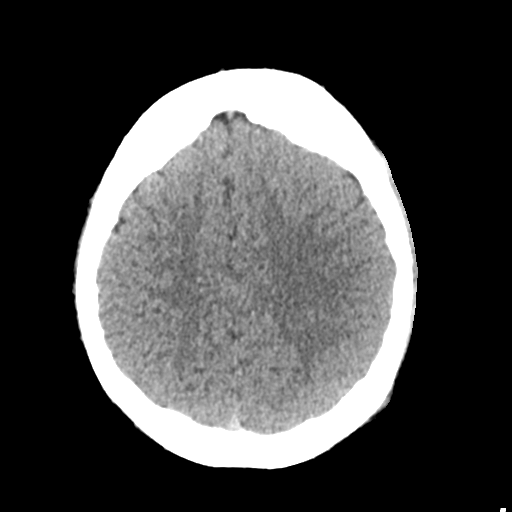
[im 20/29  brain]
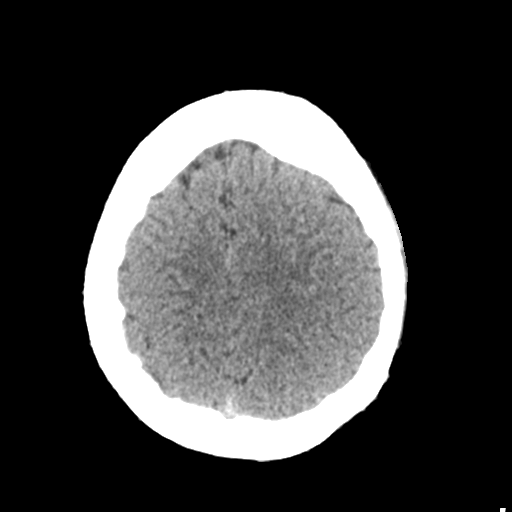
[im 24/29  brain]
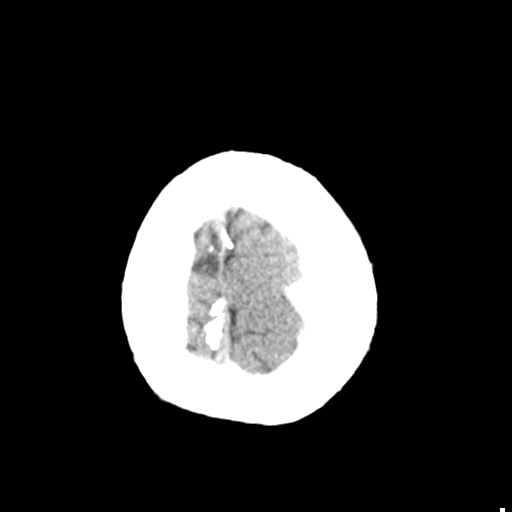
[im 26/29  brain]
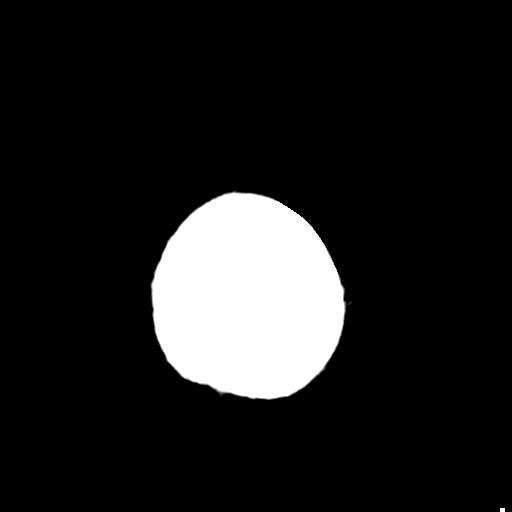
[im 26/29  bone]
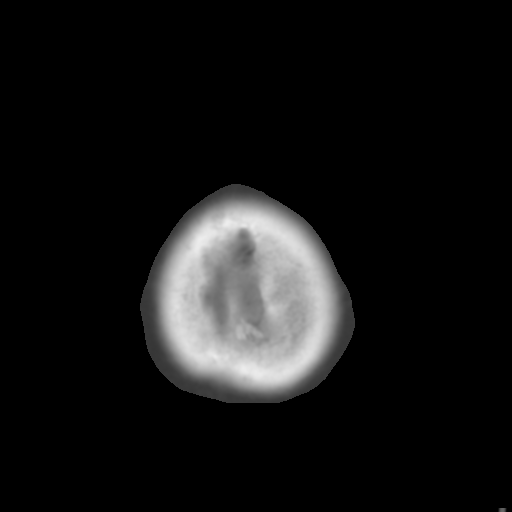

[Series 7: coronal soft tissue · coronal · 0.31mm/px · 3 of 60 slices shown]
[im 20/60  brain]
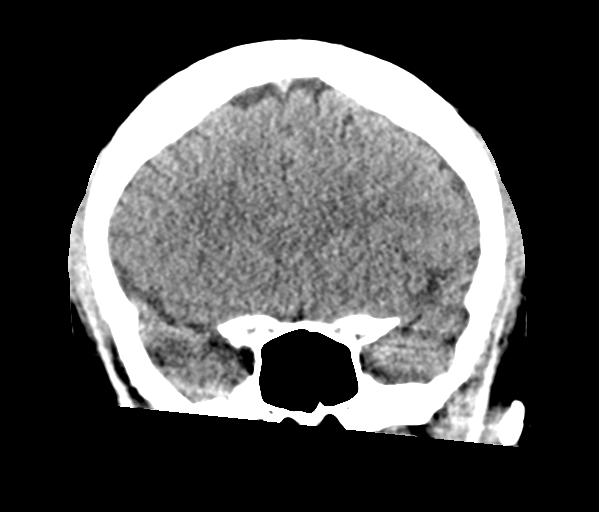
[im 27/60  brain]
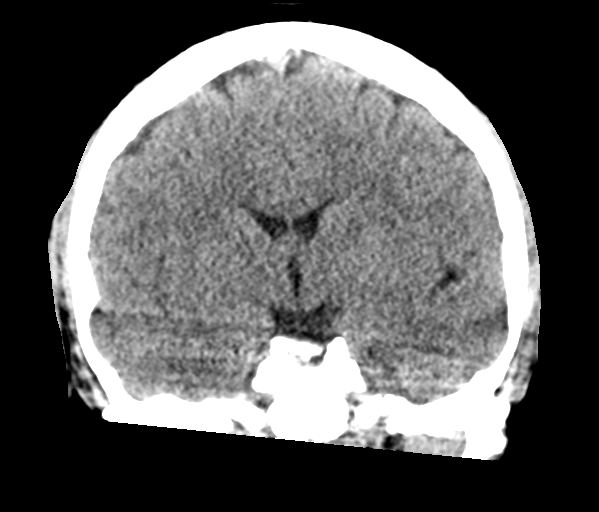
[im 33/60  brain]
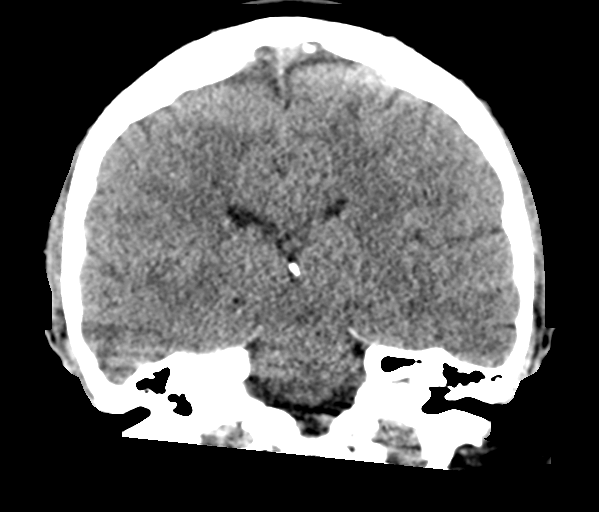

[Series 8: sagittal soft tissue · sagittal · 0.33mm/px · 3 of 53 slices shown]
[im 18/53  brain]
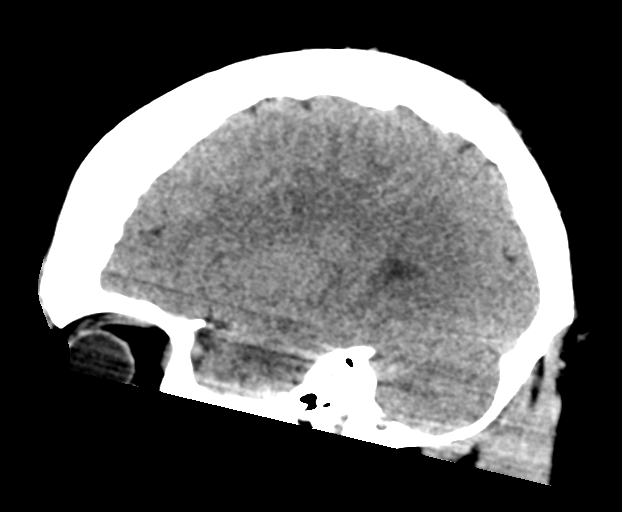
[im 27/53  brain]
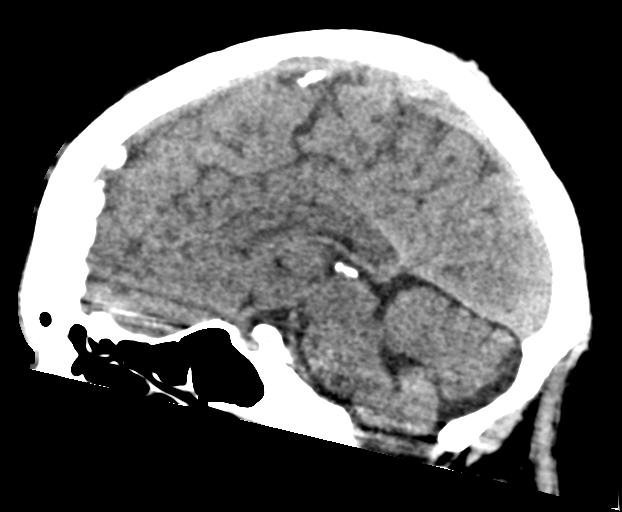
[im 35/53  brain]
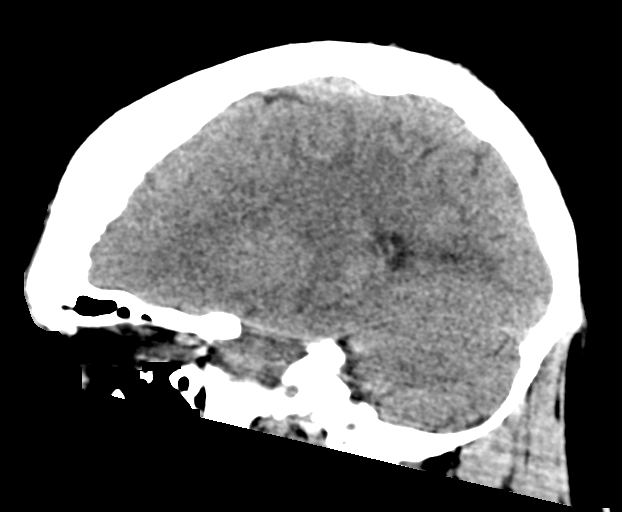

[15 of 47 positions shown; findings below may reference images not displayed]

FINDINGS: Mild motion degraded examination.

BRAIN: No intraparenchymal hemorrhage, mass effect nor midline
shift. The ventricles and sulci are normal. No acute large vascular
territory infarcts. No abnormal extra-axial fluid collections. Basal
cisterns are patent.

VASCULAR: Unremarkable.

SKULL/SOFT TISSUES: No skull fracture. No significant soft tissue
swelling.

ORBITS/SINUSES: The included ocular globes and orbital contents are
normal.Trace paranasal sinus mucosal thickening. Mastoid air cells
are well aerated.

OTHER: None.
IMPRESSION: Negative mildly motion degraded noncontrast CT HEAD.

## 2021-12-13 ENCOUNTER — Other Ambulatory Visit: Payer: MEDICAID | Attending: Pulmonary Disease

## 2021-12-13 ENCOUNTER — Other Ambulatory Visit: Payer: Self-pay

## 2021-12-13 DIAGNOSIS — G40909 Epilepsy, unspecified, not intractable, without status epilepticus: Secondary | ICD-10-CM | POA: Insufficient documentation

## 2021-12-13 LAB — CBC WITH DIFF
BASOPHIL #: 0.1 10*3/uL (ref 0.00–0.30)
BASOPHIL %: 1 % (ref 0–3)
EOSINOPHIL #: 0.3 10*3/uL (ref 0.00–0.80)
EOSINOPHIL %: 4 % (ref 0–7)
HCT: 44.3 % (ref 42.0–51.0)
HGB: 15.1 g/dL (ref 13.5–18.0)
LYMPHOCYTE #: 2.8 10*3/uL (ref 1.10–5.00)
LYMPHOCYTE %: 32 % (ref 25–45)
MCH: 30.1 pg (ref 27.0–32.0)
MCHC: 34.2 g/dL (ref 32.0–36.0)
MCV: 88.1 fL (ref 78.0–99.0)
MONOCYTE #: 0.5 10*3/uL (ref 0.00–1.30)
MONOCYTE %: 6 % (ref 0–12)
MPV: 8.2 fL (ref 7.4–10.4)
NEUTROPHIL #: 5.1 10*3/uL (ref 1.80–8.40)
NEUTROPHIL %: 58 % (ref 40–76)
PLATELETS: 262 10*3/uL (ref 140–440)
RBC: 5.03 10*6/uL (ref 4.20–6.00)
RDW: 13.3 % (ref 11.6–14.8)
WBC: 8.7 10*3/uL (ref 4.0–10.5)
WBCS UNCORRECTED: 8.7 10*3/uL

## 2021-12-13 LAB — COMPREHENSIVE METABOLIC PANEL, NON-FASTING
ALBUMIN/GLOBULIN RATIO: 1.6 — ABNORMAL HIGH (ref 0.8–1.4)
ALBUMIN: 4.2 g/dL (ref 3.5–5.7)
ALKALINE PHOSPHATASE: 43 U/L (ref 34–104)
ALT (SGPT): 31 U/L (ref 7–52)
ANION GAP: 8 mmol/L (ref 4–13)
AST (SGOT): 28 U/L (ref 13–39)
BILIRUBIN TOTAL: 0.4 mg/dL (ref 0.3–1.2)
BUN/CREA RATIO: 18 (ref 6–22)
BUN: 18 mg/dL (ref 7–25)
CALCIUM, CORRECTED: 9 mg/dL (ref 8.9–10.8)
CALCIUM: 9.2 mg/dL (ref 8.6–10.3)
CHLORIDE: 105 mmol/L (ref 98–107)
CO2 TOTAL: 26 mmol/L (ref 21–31)
CREATININE: 1.01 mg/dL (ref 0.60–1.30)
ESTIMATED GFR: 99 mL/min/{1.73_m2} (ref >59)
GLOBULIN: 2.7 — ABNORMAL LOW (ref 2.9–5.4)
GLUCOSE: 104 mg/dL (ref 74–109)
OSMOLALITY, CALCULATED: 280 mOsm/kg (ref 270–290)
POTASSIUM: 4 mmol/L (ref 3.5–5.1)
PROTEIN TOTAL: 6.9 g/dL (ref 6.4–8.9)
SODIUM: 139 mmol/L (ref 136–145)

## 2021-12-13 LAB — HGA1C (HEMOGLOBIN A1C WITH EST AVG GLUCOSE): HEMOGLOBIN A1C: 5.2 % (ref 4.0–6.0)

## 2021-12-13 LAB — VITAMIN D 25 TOTAL: VITAMIN D: 34 ng/mL (ref 30–100)

## 2022-06-27 ENCOUNTER — Other Ambulatory Visit: Payer: Self-pay

## 2022-06-27 ENCOUNTER — Other Ambulatory Visit: Payer: MEDICAID | Attending: Family Medicine

## 2022-06-27 DIAGNOSIS — G40909 Epilepsy, unspecified, not intractable, without status epilepticus: Secondary | ICD-10-CM

## 2022-06-27 LAB — COMPREHENSIVE METABOLIC PANEL, NON-FASTING
ALBUMIN/GLOBULIN RATIO: 1.7 — ABNORMAL HIGH (ref 0.8–1.4)
ALBUMIN: 4.3 g/dL (ref 3.5–5.7)
ALKALINE PHOSPHATASE: 39 U/L (ref 34–104)
ALT (SGPT): 18 U/L (ref 7–52)
ANION GAP: 5 mmol/L (ref 4–13)
AST (SGOT): 23 U/L (ref 13–39)
BILIRUBIN TOTAL: 0.2 mg/dL — ABNORMAL LOW (ref 0.3–1.2)
BUN/CREA RATIO: 21 (ref 6–22)
BUN: 20 mg/dL (ref 7–25)
CALCIUM, CORRECTED: 8.7 mg/dL — ABNORMAL LOW (ref 8.9–10.8)
CALCIUM: 8.9 mg/dL (ref 8.6–10.3)
CHLORIDE: 106 mmol/L (ref 98–107)
CO2 TOTAL: 28 mmol/L (ref 21–31)
CREATININE: 0.97 mg/dL (ref 0.60–1.30)
ESTIMATED GFR: 103 mL/min/{1.73_m2} (ref >59)
GLOBULIN: 2.6 — ABNORMAL LOW (ref 2.9–5.4)
GLUCOSE: 93 mg/dL (ref 74–109)
OSMOLALITY, CALCULATED: 280 mOsm/kg (ref 270–290)
POTASSIUM: 4.2 mmol/L (ref 3.5–5.1)
PROTEIN TOTAL: 6.9 g/dL (ref 6.4–8.9)
SODIUM: 139 mmol/L (ref 136–145)

## 2022-06-27 LAB — CBC WITH DIFF
BASOPHIL #: 0.1 10*3/uL (ref 0.00–0.10)
BASOPHIL %: 1 % (ref 0–1)
EOSINOPHIL #: 0.3 10*3/uL (ref 0.00–0.50)
EOSINOPHIL %: 4 %
HCT: 44.1 % (ref 36.7–47.1)
HGB: 15 g/dL (ref 12.5–16.3)
LYMPHOCYTE #: 2.8 10*3/uL (ref 1.00–3.00)
LYMPHOCYTE %: 33 % (ref 16–44)
MCH: 29.7 pg (ref 23.8–33.4)
MCHC: 33.9 g/dL (ref 32.5–36.3)
MCV: 87.7 fL (ref 73.0–96.2)
MONOCYTE #: 0.6 10*3/uL (ref 0.30–1.00)
MONOCYTE %: 7 % (ref 5–13)
MPV: 8.4 fL (ref 7.4–11.4)
NEUTROPHIL #: 4.8 10*3/uL (ref 1.85–7.80)
NEUTROPHIL %: 56 % (ref 43–77)
PLATELETS: 282 10*3/uL (ref 140–440)
RBC: 5.03 10*6/uL (ref 4.06–5.63)
RDW: 14 % (ref 12.1–16.2)
WBC: 8.6 10*3/uL (ref 3.6–10.2)

## 2022-06-27 LAB — VALPROIC ACID LEVEL: VALPROIC ACID LEVEL: 54.1 ug/mL (ref 50.0–100.0)

## 2023-03-05 ENCOUNTER — Other Ambulatory Visit: Payer: Self-pay

## 2023-03-05 ENCOUNTER — Other Ambulatory Visit: Payer: MEDICAID | Attending: PSYCHIATRY AND NEUROLOGY-CHILD AND ADOLESCENT PSYCHIATRY

## 2023-03-05 DIAGNOSIS — G40909 Epilepsy, unspecified, not intractable, without status epilepticus: Secondary | ICD-10-CM | POA: Insufficient documentation

## 2023-03-05 LAB — VALPROIC ACID LEVEL: VALPROIC ACID LEVEL: 67 ug/mL (ref 50.0–100.0)

## 2023-07-09 ENCOUNTER — Ambulatory Visit: Payer: MEDICAID | Attending: PSYCHIATRY AND NEUROLOGY-CHILD AND ADOLESCENT PSYCHIATRY

## 2023-07-09 ENCOUNTER — Other Ambulatory Visit: Payer: Self-pay

## 2023-07-09 DIAGNOSIS — G40909 Epilepsy, unspecified, not intractable, without status epilepticus: Secondary | ICD-10-CM | POA: Insufficient documentation

## 2023-07-10 LAB — COMPREHENSIVE METABOLIC PANEL, NON-FASTING
ALBUMIN/GLOBULIN RATIO: 1.6 — ABNORMAL HIGH (ref 0.8–1.4)
ALBUMIN: 4.2 g/dL (ref 3.5–5.7)
ALKALINE PHOSPHATASE: 39 U/L (ref 34–104)
ALT (SGPT): 15 U/L (ref 7–52)
ANION GAP: 7 mmol/L (ref 4–13)
AST (SGOT): 19 U/L (ref 13–39)
BILIRUBIN TOTAL: 0.3 mg/dL (ref 0.3–1.0)
BUN/CREA RATIO: 20 (ref 6–22)
BUN: 18 mg/dL (ref 7–25)
CALCIUM, CORRECTED: 8.9 mg/dL (ref 8.9–10.8)
CALCIUM: 9.1 mg/dL (ref 8.6–10.3)
CHLORIDE: 106 mmol/L (ref 98–107)
CO2 TOTAL: 26 mmol/L (ref 21–31)
CREATININE: 0.9 mg/dL (ref 0.60–1.30)
ESTIMATED GFR: 112 mL/min/{1.73_m2} (ref >59)
GLOBULIN: 2.6 (ref 2.0–3.5)
GLUCOSE: 103 mg/dL (ref 74–109)
OSMOLALITY, CALCULATED: 280 mosm/kg (ref 270–290)
POTASSIUM: 4 mmol/L (ref 3.5–5.1)
PROTEIN TOTAL: 6.8 g/dL (ref 6.4–8.9)
SODIUM: 139 mmol/L (ref 136–145)

## 2023-07-10 LAB — CBC WITH DIFF
BASOPHIL #: 0.1 10*3/uL (ref 0.00–0.10)
BASOPHIL %: 1 % (ref 0–1)
EOSINOPHIL #: 0.3 10*3/uL (ref 0.00–0.50)
EOSINOPHIL %: 3 % (ref 1–8)
HCT: 44 % (ref 36.7–47.1)
HGB: 15 g/dL (ref 12.5–16.3)
LYMPHOCYTE #: 3.1 10*3/uL (ref 1.00–3.20)
LYMPHOCYTE %: 32 % (ref 15–43)
MCH: 30.6 pg (ref 23.8–33.4)
MCHC: 34.2 g/dL (ref 32.5–36.3)
MCV: 89.6 fL (ref 73.0–96.2)
MONOCYTE #: 0.5 10*3/uL (ref 0.30–1.10)
MONOCYTE %: 5 % — ABNORMAL LOW (ref 6–14)
MPV: 9.2 fL (ref 7.4–11.4)
NEUTROPHIL #: 5.9 10*3/uL (ref 1.70–7.60)
NEUTROPHIL %: 60 % (ref 44–74)
PLATELETS: 178 10*3/uL (ref 140–440)
RBC: 4.91 10*6/uL (ref 4.06–5.63)
RDW: 14 % (ref 12.1–16.2)
WBC: 9.7 10*3/uL (ref 3.6–10.2)

## 2023-07-10 LAB — VALPROIC ACID LEVEL: VALPROIC ACID LEVEL: 105 ug/mL (ref 50.0–100.0)

## 2023-07-10 LAB — VITAMIN D 25 TOTAL: VITAMIN D 25, TOTAL: 27.63 ng/mL — ABNORMAL LOW (ref 30.00–100.00)

## 2023-07-10 LAB — SCAN DIFFERENTIAL: PLATELET MORPHOLOGY COMMENT: NORMAL

## 2023-12-31 ENCOUNTER — Other Ambulatory Visit: Payer: Self-pay

## 2023-12-31 ENCOUNTER — Ambulatory Visit: Payer: MEDICAID

## 2023-12-31 DIAGNOSIS — G40909 Epilepsy, unspecified, not intractable, without status epilepticus: Secondary | ICD-10-CM | POA: Insufficient documentation

## 2023-12-31 LAB — CBC WITH DIFF
BASOPHIL #: 0.1 x10ˆ3/uL (ref 0.00–0.10)
BASOPHIL %: 1 % (ref 0–1)
EOSINOPHIL #: 0.1 x10ˆ3/uL (ref 0.00–0.60)
EOSINOPHIL %: 2 % (ref 1–8)
HCT: 46 % (ref 36.7–47.1)
HGB: 14.6 g/dL (ref 12.5–16.3)
LYMPHOCYTE #: 2 x10ˆ3/uL (ref 1.00–3.00)
LYMPHOCYTE %: 27 % (ref 15–43)
MCH: 28.4 pg (ref 23.8–33.4)
MCHC: 31.8 g/dL — ABNORMAL LOW (ref 32.5–36.3)
MCV: 89.3 fL (ref 73.0–96.2)
MONOCYTE #: 0.6 x10ˆ3/uL (ref 0.30–1.10)
MONOCYTE %: 8 % (ref 6–14)
MPV: 8.7 fL (ref 7.4–11.4)
NEUTROPHIL #: 4.5 x10ˆ3/uL (ref 1.85–7.84)
NEUTROPHIL %: 62 % (ref 44–74)
PLATELETS: 194 x10ˆ3/uL (ref 140–440)
RBC: 5.14 x10ˆ6/uL (ref 4.06–5.63)
RDW: 14.1 % (ref 12.1–16.2)
WBC: 7.3 x10ˆ3/uL (ref 3.6–10.2)

## 2023-12-31 LAB — COMPREHENSIVE METABOLIC PANEL, NON-FASTING
ALBUMIN/GLOBULIN RATIO: 1.6 — ABNORMAL HIGH (ref 0.8–1.4)
ALBUMIN: 4.4 g/dL (ref 3.5–5.7)
ALKALINE PHOSPHATASE: 40 U/L (ref 34–104)
ALT (SGPT): 31 U/L (ref 7–52)
ANION GAP: 10 mmol/L (ref 4–13)
AST (SGOT): 33 U/L (ref 13–39)
BILIRUBIN TOTAL: 0.5 mg/dL (ref 0.3–1.0)
BUN/CREA RATIO: 20 (ref 6–22)
BUN: 20 mg/dL (ref 7–25)
CALCIUM, CORRECTED: 9.4 mg/dL (ref 8.9–10.8)
CALCIUM: 9.7 mg/dL (ref 8.6–10.3)
CHLORIDE: 103 mmol/L (ref 98–107)
CO2 TOTAL: 28 mmol/L (ref 21–31)
CREATININE: 0.99 mg/dL (ref 0.60–1.30)
ESTIMATED GFR: 100 mL/min/1.73mˆ2 (ref >59)
GLOBULIN: 2.8 (ref 2.0–3.5)
GLUCOSE: 110 mg/dL — ABNORMAL HIGH (ref 74–109)
OSMOLALITY, CALCULATED: 285 mosm/kg (ref 270–290)
POTASSIUM: 3.9 mmol/L (ref 3.5–5.1)
PROTEIN TOTAL: 7.2 g/dL (ref 6.4–8.9)
SODIUM: 141 mmol/L (ref 136–145)

## 2023-12-31 LAB — VALPROIC ACID LEVEL: VALPROIC ACID LEVEL: 87 ug/mL (ref 50.0–100.0)
